# Patient Record
Sex: Male | Born: 1985 | Race: Black or African American | Hispanic: No | Marital: Single | Smoking: Current every day smoker
Health system: Southern US, Community
[De-identification: ages and names within clinical notes are randomized; demographics above are authoritative.]

## PROBLEM LIST (undated history)

## (undated) DIAGNOSIS — M549 Dorsalgia, unspecified: Secondary | ICD-10-CM

## (undated) DIAGNOSIS — G8929 Other chronic pain: Secondary | ICD-10-CM

## (undated) DIAGNOSIS — R011 Cardiac murmur, unspecified: Secondary | ICD-10-CM

---

## 2010-05-04 ENCOUNTER — Emergency Department (HOSPITAL_COMMUNITY): Admission: EM | Admit: 2010-05-04 | Discharge: 2010-05-04 | Payer: Self-pay | Admitting: Emergency Medicine

## 2010-11-29 ENCOUNTER — Emergency Department (HOSPITAL_COMMUNITY)
Admission: EM | Admit: 2010-11-29 | Discharge: 2010-11-29 | Payer: Self-pay | Source: Home / Self Care | Admitting: Emergency Medicine

## 2011-02-13 ENCOUNTER — Emergency Department (HOSPITAL_COMMUNITY)
Admission: EM | Admit: 2011-02-13 | Discharge: 2011-02-14 | Disposition: A | Discharge status: Home / Self Care | Payer: Worker's Compensation | Attending: Emergency Medicine | Admitting: Emergency Medicine

## 2011-02-13 DIAGNOSIS — M545 Low back pain, unspecified: Secondary | ICD-10-CM | POA: Insufficient documentation

## 2011-02-13 DIAGNOSIS — G8929 Other chronic pain: Secondary | ICD-10-CM | POA: Insufficient documentation

## 2012-09-19 ENCOUNTER — Encounter (HOSPITAL_COMMUNITY): Payer: Self-pay | Admitting: *Deleted

## 2012-09-19 DIAGNOSIS — M549 Dorsalgia, unspecified: Secondary | ICD-10-CM | POA: Insufficient documentation

## 2012-09-19 DIAGNOSIS — N5089 Other specified disorders of the male genital organs: Secondary | ICD-10-CM | POA: Insufficient documentation

## 2012-09-19 DIAGNOSIS — N453 Epididymo-orchitis: Secondary | ICD-10-CM | POA: Insufficient documentation

## 2012-09-19 LAB — URINALYSIS, ROUTINE W REFLEX MICROSCOPIC
Glucose, UA: NEGATIVE mg/dL
Hgb urine dipstick: NEGATIVE
Leukocytes, UA: NEGATIVE
Protein, ur: NEGATIVE mg/dL
Specific Gravity, Urine: 1.03 (ref 1.005–1.030)
Urobilinogen, UA: 1 mg/dL (ref 0.0–1.0)
pH: 6 (ref 5.0–8.0)

## 2012-09-19 LAB — COMPREHENSIVE METABOLIC PANEL
AST: 18 U/L (ref 0–37)
BUN: 13 mg/dL (ref 6–23)
Creatinine, Ser: 1.2 mg/dL (ref 0.50–1.35)
Potassium: 3.7 mEq/L (ref 3.5–5.1)
Total Protein: 7.5 g/dL (ref 6.0–8.3)

## 2012-09-19 LAB — LIPASE, BLOOD: Lipase: 54 U/L (ref 11–59)

## 2012-09-19 LAB — CBC WITH DIFFERENTIAL/PLATELET
Basophils Absolute: 0 10*3/uL (ref 0.0–0.1)
Basophils Relative: 0 % (ref 0–1)
Eosinophils Absolute: 0.1 10*3/uL (ref 0.0–0.7)
Eosinophils Relative: 1 % (ref 0–5)
HCT: 42 % (ref 39.0–52.0)
Lymphocytes Relative: 19 % (ref 12–46)
MCV: 80.6 fL (ref 78.0–100.0)
Monocytes Absolute: 0.4 10*3/uL (ref 0.1–1.0)
Monocytes Relative: 6 % (ref 3–12)
Neutro Abs: 5.3 10*3/uL (ref 1.7–7.7)
Platelets: 167 10*3/uL (ref 150–400)
RBC: 5.21 MIL/uL (ref 4.22–5.81)
RDW: 11.9 % (ref 11.5–15.5)

## 2012-09-19 NOTE — ED Notes (Signed)
The pt has several symptoms with testicle swelling just noticed this am.  Also lower back pain for awhile with abd pain for several days.    No nv or diarrhea some chills

## 2012-09-20 ENCOUNTER — Emergency Department (HOSPITAL_COMMUNITY): Payer: Self-pay

## 2012-09-20 ENCOUNTER — Emergency Department (HOSPITAL_COMMUNITY)
Admission: EM | Admit: 2012-09-20 | Discharge: 2012-09-20 | Disposition: A | Discharge status: Home / Self Care | Payer: Self-pay | Attending: Emergency Medicine | Admitting: Emergency Medicine

## 2012-09-20 DIAGNOSIS — N453 Epididymo-orchitis: Secondary | ICD-10-CM

## 2012-09-20 MED ORDER — NAPROXEN 500 MG PO TABS: 500.0000 mg | ORAL_TABLET | Freq: Two times a day (BID) | ORAL | Status: DC

## 2012-09-20 MED ORDER — OXYCODONE-ACETAMINOPHEN 5-325 MG PO TABS: 1.0000 | ORAL_TABLET | ORAL | Status: DC | PRN

## 2012-09-20 MED ORDER — CEFTRIAXONE SODIUM 1 G IJ SOLR
INTRAMUSCULAR | Status: AC
  Filled 2012-09-20: qty 10

## 2012-09-20 MED ORDER — OXYCODONE-ACETAMINOPHEN 5-325 MG PO TABS
ORAL_TABLET | ORAL | Status: AC
  Filled 2012-09-20: qty 2

## 2012-09-20 MED ORDER — LIDOCAINE HCL (PF) 1 % IJ SOLN
INTRAMUSCULAR | Status: AC
  Filled 2012-09-20: qty 5

## 2012-09-20 MED ORDER — DOXYCYCLINE HYCLATE 100 MG PO CAPS: 100.0000 mg | ORAL_CAPSULE | Freq: Two times a day (BID) | ORAL | Status: DC

## 2012-09-20 MED ORDER — AZITHROMYCIN 250 MG PO TABS
ORAL_TABLET | ORAL | Status: AC
  Filled 2012-09-20: qty 4

## 2012-09-20 NOTE — ED Provider Notes (Signed)
Swelling of testicle with pain X 24 hours, persistent, worse with palpatino, no d/c or f/c/n/v.    On exam has swollen and ttp testicle on R, normal cremasteric reflex, no urethral d/c, scrotum normal  [place] neg for torsion - likely epididymo orchitis - abx given, swabbed, f/u with Uro  Medical screening examination/treatment/procedure(s) were conducted as a shared visit with non-physician practitioner(s) and myself.  I personally evaluated the patient during the encounter    Vida Roller, MD 09/20/12 [phone]

## 2012-09-21 LAB — GC/CHLAMYDIA PROBE AMP, GENITAL: GC Probe Amp, Genital: NEGATIVE

## 2012-09-21 NOTE — ED Provider Notes (Signed)
Medical screening examination/treatment/procedure(s) were conducted as a shared visit with non-physician practitioner(s) and myself.  I personally evaluated the patient during the encounter  Please see my separate respective documentation pertaining to this patient encounter   Vida Roller, MD 09/21/12 1601

## 2012-09-21 NOTE — ED Provider Notes (Signed)
History     CSN: [id number]  Arrival date & time 09/19/12  2246   First MD Initiated Contact with Patient 09/20/12 0246      Chief Complaint  Patient presents with  . Groin Swelling    (Consider location/radiation/quality/duration/timing/severity/associated sxs/prior treatment) HPI Comments: 26 y/o male presents to the ED complaining of right sided testicular pain and swelling he noticed yesterday. Pain radiates down into his groin area and lower back. Describes pain as sharp rated 10/10 worse with palpation or movement. Has not tried any alleviating factors. Denies nausea, vomiting, fever, chills, penile pain or discharge. Has only had 1 sexual partner for the past year and a half.   The history is provided by the patient.    History reviewed. No pertinent past medical history.  History reviewed. No pertinent past surgical history.  No family history on file.  History  Substance Use Topics  . Smoking status: Never Smoker   . Smokeless tobacco: Not on file  . Alcohol Use: Yes      Review of Systems  Constitutional: Negative for fever and chills.  HENT: Negative for neck pain and neck stiffness.   Respiratory: Negative for shortness of breath.   Cardiovascular: Negative for chest pain.  Gastrointestinal: Negative for nausea, vomiting and abdominal pain.  Genitourinary: Positive for scrotal swelling and testicular pain. Negative for decreased urine volume, discharge, penile swelling, difficulty urinating and penile pain.  Musculoskeletal: Positive for back pain.  Skin: Negative for color change.  Neurological: Negative for dizziness and numbness.  Psychiatric/Behavioral: The patient is not nervous/anxious.     Allergies  Review of patient's allergies indicates not on file.  Home Medications   Current Outpatient Rx  Name  Route  Sig  Dispense  Refill  . DOXYCYCLINE HYCLATE 100 MG PO CAPS   Oral   Take 1 capsule (100 mg total) by mouth 2 (two) times daily.   20  capsule   0   . NAPROXEN 500 MG PO TABS   Oral   Take 1 tablet (500 mg total) by mouth 2 (two) times daily with a meal.   30 tablet   0   . OXYCODONE-ACETAMINOPHEN 5-325 MG PO TABS   Oral   Take 1 tablet by mouth every 4 (four) hours as needed for pain.   20 tablet   0     BP 128/66  Pulse 69  Temp 100.1 F (37.8 C) (Oral)  Resp 18  SpO2 98%  Physical Exam  Nursing note and vitals reviewed. Constitutional: He is oriented to person, place, and time. He appears well-developed and well-nourished. No distress.  HENT:  Head: Normocephalic and atraumatic.  Eyes: Conjunctivae normal and EOM are normal.  Neck: Normal range of motion. Neck supple.  Cardiovascular: Normal rate, regular rhythm and normal heart sounds.   Pulmonary/Chest: Effort normal and breath sounds normal.  Abdominal: Soft. Bowel sounds are normal. There is no tenderness.  Genitourinary: Penis normal. Right testis shows swelling and tenderness. Right testis shows no mass. Right testis is descended. Cremasteric reflex is not absent on the right side. Cremasteric reflex is not absent on the left side. Circumcised.       Testicles laying in anatomical position  Musculoskeletal: Normal range of motion. He exhibits no edema.  Neurological: He is alert and oriented to person, place, and time.  Skin: Skin is warm and dry. No erythema.  Psychiatric: He has a normal mood and affect. His behavior is normal.    ED  Course  Procedures (including critical care time)  Labs Reviewed  CBC WITH DIFFERENTIAL - Abnormal; Notable for the following:    MCHC 36.4 (*)     All other components within normal limits  COMPREHENSIVE METABOLIC PANEL - Abnormal; Notable for the following:    Glucose, Bld 120 (*)     GFR calc non Af Amer 82 (*)     All other components within normal limits  URINALYSIS, ROUTINE W REFLEX MICROSCOPIC  LIPASE, BLOOD  GC/CHLAMYDIA PROBE AMP, GENITAL   US Scrotum  09/20/2012  ------------------   *RADIOLOGY REPORT*  Clinical Data:  Back pain and scrotal swelling.  Bilateral testicular pain and right scrotal swelling.  SCROTAL ULTRASOUND DOPPLER ULTRASOUND OF THE TESTICLES  Technique: Complete ultrasound examination of the testicles, epididymis, and other scrotal structures was performed.  Color and spectral Doppler ultrasound were also utilized to evaluate blood flow to the testicles.  Comparison:  None.  Findings:  Right testis:  The right testis measures 4.9 x 2.5 x 3.8 cm. Normal homogeneous parenchymal echotexture without focal mass lesion.  Suggestion of increased testicular flow on color flow Doppler images.  Left testis:  The left testis measures 4.4 x 2.4 x 3.5 cm.  Normal homogeneous parenchymal echotexture without focal mass lesion. Suggestion of increased testicular flow on color flow Doppler images.  Right epididymis:  Right epididymis demonstrates normal flow. Small cyst or spermatic seal measuring about 3 mm diameter.  Left epididymis:  Left epididymis demonstrates mildly increased flow but is otherwise normal in size and appearance.  Hydrocele:  No scrotal hydroceles.  Varicocele:  No scrotal varicoceles.  Pulsed Doppler interrogation of both testes demonstrates low resistance flow bilaterally. Arterial and venous flow waveform patterns are obtained bilaterally.  IMPRESSION: Mild symmetrical increased flow demonstrated to both testes and to the left epididymis may represent mild epididymo-orchitis.  No solid mass.  No signs of torsion.   Original Report Authenticated By: Burman Nieves, M.D.      1. Epididymoorchitis       MDM  Scrotal US negative for torsion. Discussed tx for gc/chlamydia with patient. He was agreeable. IM rocephin and 1 g azithromycin given. Case discussed with Dr. Hyacinth Meeker who also evaluated patient and agrees with plan of care.        Trevor Mace, PA-C 09/21/12 1401

## 2013-07-02 ENCOUNTER — Encounter (HOSPITAL_COMMUNITY): Payer: Self-pay | Admitting: *Deleted

## 2013-07-02 ENCOUNTER — Emergency Department (HOSPITAL_COMMUNITY)
Admission: EM | Admit: 2013-07-02 | Discharge: 2013-07-02 | Disposition: A | Discharge status: Home / Self Care | Payer: Self-pay | Attending: Emergency Medicine | Admitting: Emergency Medicine

## 2013-07-02 DIAGNOSIS — G8929 Other chronic pain: Secondary | ICD-10-CM | POA: Insufficient documentation

## 2013-07-02 DIAGNOSIS — Z87828 Personal history of other (healed) physical injury and trauma: Secondary | ICD-10-CM | POA: Insufficient documentation

## 2013-07-02 DIAGNOSIS — M5430 Sciatica, unspecified side: Secondary | ICD-10-CM | POA: Insufficient documentation

## 2013-07-02 DIAGNOSIS — M5431 Sciatica, right side: Secondary | ICD-10-CM

## 2013-07-02 DIAGNOSIS — F17200 Nicotine dependence, unspecified, uncomplicated: Secondary | ICD-10-CM | POA: Insufficient documentation

## 2013-07-02 DIAGNOSIS — Z791 Long term (current) use of non-steroidal anti-inflammatories (NSAID): Secondary | ICD-10-CM | POA: Insufficient documentation

## 2013-07-02 HISTORY — DX: Dorsalgia, unspecified: M54.9

## 2013-07-02 HISTORY — DX: Other chronic pain: G89.29

## 2013-07-02 MED ORDER — PREDNISONE 50 MG PO TABS: 50.0000 mg | ORAL_TABLET | Freq: Every day | ORAL | Status: DC

## 2013-07-02 MED ORDER — KETOROLAC TROMETHAMINE 60 MG/2ML IM SOLN
60.0000 mg | Freq: Once | INTRAMUSCULAR | Status: AC
  Administered 2013-07-02: 60 mg via INTRAMUSCULAR
  Filled 2013-07-02: qty 2

## 2013-07-02 MED ORDER — HYDROCODONE-ACETAMINOPHEN 5-325 MG PO TABS
1.0000 | ORAL_TABLET | Freq: Once | ORAL | Status: AC
  Administered 2013-07-02: 1 via ORAL
  Filled 2013-07-02: qty 1

## 2013-07-02 MED ORDER — CYCLOBENZAPRINE HCL 10 MG PO TABS: 10.0000 mg | ORAL_TABLET | Freq: Three times a day (TID) | ORAL | Status: DC | PRN

## 2013-07-02 MED ORDER — HYDROCODONE-ACETAMINOPHEN 5-325 MG PO TABS: 1.0000 | ORAL_TABLET | Freq: Four times a day (QID) | ORAL | Status: DC | PRN

## 2013-07-02 NOTE — ED Provider Notes (Signed)
  CSN: [id number]     Arrival date & time 07/02/13  0810 History     First MD Initiated Contact with Patient 07/02/13 0813     Chief Complaint  Patient presents with  . Back Pain   (Consider location/radiation/quality/duration/timing/severity/associated sxs/prior Treatment) HPI Patient presents to the emergency department with lower back pain that started yesterday.  Patient, states he has a history of lower back pain from an injury to and after 3 years ago.  Patient, states, that he found that his grandmother died and laid on the floor for an extended amount of time and noted that his back pain started after this episode.  Patient, states, that he did not take any medications prior to arrival for his symptoms.  Patient, states he use heat on his lower back without significant relief.  Patient, states, that he has not have any chest pain, shortness of breath, fever, cough, dizziness, nausea, vomiting, abdominal pain, weakness, or numbness.  Patient, states pain does seem to go into his right leg from his lower back.  Movement and palpation make his pain, worse.  Lying flat seems to help with his discomfort Past Medical History  Diagnosis Date  . Chronic back pain    History reviewed. No pertinent past surgical history. No family history on file. History  Substance Use Topics  . Smoking status: Current Every Day Smoker  . Smokeless tobacco: Not on file  . Alcohol Use: Yes    Review of Systems All other systems negative except as documented in the HPI. All pertinent positives and negatives as reviewed in the HPI. Allergies  Review of patient's allergies indicates no known allergies.  Home Medications   Current Outpatient Rx  Name  Route  Sig  Dispense  Refill  . doxycycline (VIBRAMYCIN) 100 MG capsule   Oral   Take 1 capsule (100 mg total) by mouth 2 (two) times daily.   20 capsule   0   . naproxen (NAPROSYN) 500 MG tablet   Oral   Take 1 tablet (500 mg total) by mouth 2 (two)  times daily with a meal.   30 tablet   0   . oxyCODONE-acetaminophen (PERCOCET) 5-325 MG per tablet   Oral   Take 1 tablet by mouth every 4 (four) hours as needed for pain.   20 tablet   0    BP 143/76  Pulse 79  Temp(Src) 97.5 F (36.4 C) (Oral)  Resp 20  SpO2 100% Physical Exam  Nursing note and vitals reviewed. Constitutional: He is oriented to person, place, and time. He appears well-developed and well-nourished.  HENT:  Head: Normocephalic and atraumatic.  Cardiovascular: Normal rate and regular rhythm.   Pulmonary/Chest: Effort normal and breath sounds normal.  Neurological: He is alert and oriented to person, place, and time. He has normal strength and normal reflexes. He displays normal reflexes. No sensory deficit. He exhibits normal muscle tone. Coordination normal.  Skin: Skin is warm and dry.    ED Course   Procedures (including critical care time) Patient does not have any focal neurological deficits or motor deficits noted on exam.  He has normal deep tendon reflexes.  The patient will be given pain control and advised to use ice and heat on his lower back.  Also doesn't follow up with a doctor.  He was seen previously for his lower back issues for any recheck.  MDM    Carlyle Dolly, PA-C 07/02/13 [phone]

## 2013-07-02 NOTE — ED Notes (Signed)
C/o low back pain since last night. Had a previous injury to low back 2 years ago

## 2013-07-02 NOTE — ED Notes (Signed)
Had an injury to low back at work 2 years ago & has had problems with pain since. Has been to see a spine specialist in past & states ran out of pain meds 6 months ago. Denies recent injury. Reports pain worse with walking & sitting up. Pt drove self here & was ambulatory to room from triage. Denies urinary frequency, dysuria, incontinence.

## 2013-07-02 NOTE — ED Provider Notes (Signed)
Medical screening examination/treatment/procedure(s) were performed by non-physician practitioner and as supervising physician I was immediately available for consultation/collaboration.   Darlys Gales, MD 07/02/13 2227

## 2013-07-25 ENCOUNTER — Emergency Department (HOSPITAL_COMMUNITY)
Admission: EM | Admit: 2013-07-25 | Discharge: 2013-07-25 | Disposition: A | Discharge status: Home / Self Care | Payer: Self-pay | Attending: Emergency Medicine | Admitting: Emergency Medicine

## 2013-07-25 ENCOUNTER — Encounter (HOSPITAL_COMMUNITY): Payer: Self-pay | Admitting: Nurse Practitioner

## 2013-07-25 DIAGNOSIS — W268XXA Contact with other sharp object(s), not elsewhere classified, initial encounter: Secondary | ICD-10-CM | POA: Insufficient documentation

## 2013-07-25 DIAGNOSIS — Y929 Unspecified place or not applicable: Secondary | ICD-10-CM | POA: Insufficient documentation

## 2013-07-25 DIAGNOSIS — F17200 Nicotine dependence, unspecified, uncomplicated: Secondary | ICD-10-CM | POA: Insufficient documentation

## 2013-07-25 DIAGNOSIS — S60459A Superficial foreign body of unspecified finger, initial encounter: Secondary | ICD-10-CM | POA: Insufficient documentation

## 2013-07-25 DIAGNOSIS — Y93G9 Activity, other involving cooking and grilling: Secondary | ICD-10-CM | POA: Insufficient documentation

## 2013-07-25 DIAGNOSIS — G8929 Other chronic pain: Secondary | ICD-10-CM | POA: Insufficient documentation

## 2013-07-25 DIAGNOSIS — Z880 Allergy status to penicillin: Secondary | ICD-10-CM | POA: Insufficient documentation

## 2013-07-25 DIAGNOSIS — IMO0002 Reserved for concepts with insufficient information to code with codable children: Secondary | ICD-10-CM

## 2013-07-25 MED ORDER — DIAZEPAM 5 MG PO TABS
5.0000 mg | ORAL_TABLET | Freq: Once | ORAL | Status: AC
  Administered 2013-07-25: 5 mg via ORAL
  Filled 2013-07-25: qty 1

## 2013-07-25 NOTE — ED Notes (Addendum)
Pt was cooking last night and a hard noodle lodged underneath his R 2nd fingernail. C/o pain since. Able to visualize where the noodle went under fingernail, skin is loosened from fingernail.

## 2013-07-25 NOTE — ED Provider Notes (Addendum)
CSN: [id number]     Arrival date & time 07/25/13  1002 History   First MD Initiated Contact with Patient 07/25/13 1012     Chief Complaint  Patient presents with  . Finger Injury   (Consider location/radiation/quality/duration/timing/severity/associated sxs/prior Treatment) HPI Comments: Patient is a 27 yo M presenting to the ED for a piece of dry pasta embedded under his right index finger that occurred last evening. Patient states his is having sharp localized pain to the digit, and has been unable to remove the FB. No alleviating or aggravating factors. Denies fevers or chills.    Past Medical History  Diagnosis Date  . Chronic back pain    History reviewed. No pertinent past surgical history. History reviewed. No pertinent family history. History  Substance Use Topics  . Smoking status: Current Every Day Smoker  . Smokeless tobacco: Not on file  . Alcohol Use: Yes    Review of Systems  Constitutional: Negative for fever and chills.  Musculoskeletal: Negative for joint swelling.  Skin:       FB    Allergies  Penicillins  Home Medications   Current Outpatient Rx  Name  Route  Sig  Dispense  Refill  . cyclobenzaprine (FLEXERIL) 10 MG tablet   Oral   Take 10 mg by mouth 4 (four) times daily as needed for muscle spasms.         Marland Kitchen HYDROcodone-acetaminophen (NORCO/VICODIN) 5-325 MG per tablet   Oral   Take 1.5 tablets by mouth every 6 (six) hours as needed for pain.         . predniSONE (DELTASONE) 50 MG tablet   Oral   Take 50 mg by mouth 2 (two) times daily as needed (for back and leg pain).          BP 140/80  Pulse 71  Temp(Src) 97.9 F (36.6 C) (Oral)  Resp 16  Ht 6\' 2"  (1.88 m)  Wt 230 lb (104.327 kg)  BMI 29.52 kg/m2  SpO2 98% Physical Exam  Constitutional: He is oriented to person, place, and time. He appears well-developed and well-nourished. No distress.  HENT:  Head: Normocephalic and atraumatic.  Right Ear: External ear normal.  Left Ear:  External ear normal.  Nose: Nose normal.  Eyes: Conjunctivae are normal.  Neck: Neck supple.  Pulmonary/Chest: Effort normal.  Musculoskeletal: Normal range of motion.  Neurological: He is alert and oriented to person, place, and time.  Skin: Skin is warm and dry. He is not diaphoretic.  Pasta embedded under right index finger nail, no drainage or erythema or warmth. ROM intact in digit. TTP.   Psychiatric: He has a normal mood and affect.    ED Course  NERVE BLOCK Date/Time: 07/25/2013 11:16 AM Performed by: Jeannetta Ellis Authorized by: Jeannetta Ellis Consent: Verbal consent obtained. Indications: pain relief Body area: upper extremity Nerve: digital Laterality: right Patient sedated: no Patient position: sitting Needle gauge: 24 G Location technique: anatomical landmarks Local anesthetic: lidocaine 2% without epinephrine Anesthetic total: 10 ml Outcome: pain improved Patient tolerance: Patient tolerated the procedure well with no immediate complications.  FOREIGN BODY REMOVAL Date/Time: 08/02/2013 5:58 PM Performed by: Jeannetta Ellis Authorized by: Jeannetta Ellis Consent: Verbal consent obtained. Body area: skin General location: upper extremity Location details: right index finger Anesthesia: digital block Patient sedated: no Patient restrained: no Localization method: visualized Removal mechanism: scalpel, forceps and hemostat Dressing: antibiotic ointment and dressing applied Tendon involvement: none Depth: subcutaneous Complexity: simple 1 objects  recovered. Objects recovered: pasta noodle Post-procedure assessment: foreign body removed Patient tolerance: Patient tolerated the procedure well with no immediate complications.     (including critical care time) Labs Review Labs Reviewed - No data to display Imaging Review No results found.  MDM   1. Foreign body    Afebrile, NAD, non-toxic appearing, AAOx4. FB  underneath R index finger nail bed. Digital block provided for relief and FB removal. FB successfully removed, nail cut back to allow any infection to drain out. Advised warm soap and water soaks to keep area clean. Discussed sympomatic care. Return precautions discussed. Patient is agreeable to plan. Patient d/w with Dr. Blinda Leatherwood, agrees with plan. Patient is stable at time of discharge        Jeannetta Ellis, PA-C 07/25/13 1533  Jeannetta Ellis, PA-C 08/02/13 1759

## 2013-07-28 NOTE — ED Provider Notes (Signed)
Medical screening examination/treatment/procedure(s) were performed by non-physician practitioner and as supervising physician I was immediately available for consultation/collaboration.   Christopher J. Pollina, MD 07/28/13 1604 

## 2013-08-03 NOTE — ED Provider Notes (Signed)
Medical screening examination/treatment/procedure(s) were performed by non-physician practitioner and as supervising physician I was immediately available for consultation/collaboration.    Christopher J. Pollina, MD 08/03/13 1524 

## 2015-07-10 ENCOUNTER — Encounter (HOSPITAL_COMMUNITY): Payer: Self-pay | Admitting: *Deleted

## 2015-07-10 ENCOUNTER — Emergency Department (HOSPITAL_COMMUNITY)
Admission: EM | Admit: 2015-07-10 | Discharge: 2015-07-10 | Disposition: A | Discharge status: Home / Self Care | Payer: 59 | Attending: Physician Assistant | Admitting: Physician Assistant

## 2015-07-10 DIAGNOSIS — K0889 Other specified disorders of teeth and supporting structures: Secondary | ICD-10-CM

## 2015-07-10 DIAGNOSIS — Z720 Tobacco use: Secondary | ICD-10-CM | POA: Insufficient documentation

## 2015-07-10 DIAGNOSIS — K088 Other specified disorders of teeth and supporting structures: Secondary | ICD-10-CM | POA: Diagnosis not present

## 2015-07-10 DIAGNOSIS — K047 Periapical abscess without sinus: Secondary | ICD-10-CM | POA: Diagnosis not present

## 2015-07-10 DIAGNOSIS — Z880 Allergy status to penicillin: Secondary | ICD-10-CM | POA: Diagnosis not present

## 2015-07-10 DIAGNOSIS — G8929 Other chronic pain: Secondary | ICD-10-CM | POA: Insufficient documentation

## 2015-07-10 MED ORDER — TRAMADOL HCL 50 MG PO TABS: 50.0000 mg | ORAL_TABLET | Freq: Four times a day (QID) | ORAL | Status: DC | PRN

## 2015-07-10 MED ORDER — CLINDAMYCIN HCL 150 MG PO CAPS: 300.0000 mg | ORAL_CAPSULE | Freq: Three times a day (TID) | ORAL | Status: DC

## 2015-07-10 NOTE — Discharge Instructions (Signed)
Take the prescribed medication as directed. Follow-up with dentist-- see referral and resource guide provided. Return to the ED for new or worsening symptoms.   Emergency Department Resource Guide 1) Find a Doctor and Pay Out of Pocket Although you won't have to find out who is covered by your insurance plan, it is a good idea to ask around and get recommendations. You will then need to call the office and see if the doctor you have chosen will accept you as a new patient and what types of options they offer for patients who are self-pay. Some doctors offer discounts or will set up payment plans for their patients who do not have insurance, but you will need to ask so you aren't surprised when you get to your appointment.  2) Contact Your Local Health Department Not all health departments have doctors that can see patients for sick visits, but many do, so it is worth a call to see if yours does. If you don't know where your local health department is, you can check in your phone book. The CDC also has a tool to help you locate your state's health department, and many state websites also have listings of all of their local health departments.  3) Find a Walk-in Clinic If your illness is not likely to be very severe or complicated, you may want to try a walk in clinic. These are popping up all over the country in pharmacies, drugstores, and shopping centers. They're usually staffed by nurse practitioners or physician assistants that have been trained to treat common illnesses and complaints. They're usually fairly quick and inexpensive. However, if you have serious medical issues or chronic medical problems, these are probably not your best option.  No Primary Care Doctor: - Call Health Connect at  [phone] - they can help you locate a primary care doctor that  accepts your insurance, provides certain services, etc. - Physician Referral Service- [phone]  Chronic Pain  Problems: Organization         Address  Phone   Notes  [facility]  [phone] Patients need to be referred by their primary care doctor.   Medication Assistance: Organization         Address  Phone   Notes  [facility] Medication [facility] [address] [place] [place], [place] [address] [phone] --Must be a resident of [facility] -- Must have NO insurance coverage whatsoever (no Medicaid/ Medicare, etc.) -- The pt. MUST have a primary care doctor that directs their care regularly and follows them in the community   [facility]ist  [phone]   [employer]  [phone]    Agencies that provide inexpensive medical care: Organization         Address  Phone   Notes  [facility]  [phone]   [facility]    [phone]   [facility] [address] [place], [place] [address] [phone]   [facility] [address] [place]. [address] [place] [phone]   Planned Parenthood    [phone]   [facility]    [phone]   Community [facility] and [facility]  [address], [place] Phone:  [phone], Fax:  [phone] Hours of Operation:  9 am - 6 pm, M-F.  Also accepts Medicaid/Medicare and self-pay.  [facility]  [address], [place] Phone: [phone], Fax: [phone]. Hours of Operation:  8:30 am - 5:30 pm, M-F.  Also accepts Medicaid and self-pay.  [facility] [place] [address] [place] Phone: [phone]   [place], [place], [place] [phone], Ext. [phone] Mondays & Thursdays: 7-9 AM.  First 15 patients are seen on a first come, first serve basis.    [place] Providers:  Organization         Address  Phone   Notes  [facility] [address], [place] [phone] Also  accepts self-pay patients.  [facility] [address], [place]  [phone]   [place], [address], [place] [phone]   [facility] [address] [place] [phone]   Lucianne Lei [address], [place]   [phone] Only accepts [place] Access [place] patients after they have their name applied to their card.   Self-Pay (no insurance) in [facility]:  Organization         Address  Phone   Notes  Sickle Cell Patients, [facility] [place] [phone]   [facility] [place] [phone]   [facility] [place]  [place], [place], [place] [phone]   [facility]/Dr. Osei-Bonsu  [address], [place] or [address], [place] [phone] Phone number for both [place] and [place] locations is the same.  Urgent Medical and [facility] [address], [place] [phone]   [facility] [address] [place] or [address] [phone]   [facility] [address] [place] [address] [place] [phone], phone; [phone], fax Sees patients 1st and 3rd Saturday of every month.  Must not qualify for public or private insurance (i.e. Medicaid, Medicare, [place] Health Choice, Veterans' Benefits)  Household income should be no more than 200% of the poverty level The clinic cannot treat you if you are pregnant or think you are pregnant  Sexually transmitted diseases are not treated at the clinic.    Dental Care: Organization         Address  Phone  Notes  [facility] [place] [phone] Accepts children up to age 85 who are enrolled in [place] or [place]; pregnant  women with a Medicaid card; and children who have applied for Medicaid or [place] Health Choice, but were declined, whose parents can pay a reduced fee at time of service.  [facility] Department of [facility]  [address], [place] [phone] Accepts children up to age 44 who are enrolled in [place] or [place]; pregnant women with a Medicaid card; and children who have applied for Medicaid or [place] Health Choice, but were declined, whose parents can pay a reduced fee at time of service.  [place] [facility] PRO[facility]  [place] [phone] Patients are seen by appointment only. Walk-ins are not accepted. [place] will see patients 58 years of age and older. Monday - Tuesday (8am-5pm) Most Wednesdays (8:30-5pm) $30 per visit, cash only  [facility]  [address],  [place] [phone] Patients are seen by appointment only. Walk-ins are not accepted. [place] will see patients 62 years of age and older. One Wednesday Evening (Monthly: Volunteer Based).  $30 per visit, cash only  [place]  [phone] for adults; Children under age 77, call Graduate Pediatric Dentistry at [phone]. Children aged 45-14, please call [phone] to request a pediatric application.  Dental services are provided in all areas of dental care including fillings, crowns and bridges, complete and partial dentures, implants, gum treatment, root canals, and extractions. Preventive care is also provided. Treatment is provided to both adults and children. Patients are selected via a lottery and there is often a waiting list.   [facility] [place] [address], [place]  [phone] [email or web address]   [facility] [address] [place], [place] [phone], Ext. [phone] Second and Fourth Thursday of each month, opens at 6:30 AM; Clinic ends at 9 AM.  Patients are  seen on a first-come first-served basis, and a limited number are seen during each clinic.   [facility]  [address] [place], [place] [phone]   Eligibility Requirements You must have lived in [place], [place], or [place] counties for at least the last three months.   You cannot be eligible for state or federal sponsored [employer], including [employer], [place], or [employer].   You generally cannot be eligible for healthcare insurance through your employer.    How to apply: Eligibility screenings are held every Tuesday and Wednesday afternoon from 1:00 pm until 4:00 pm. You do not need an appointment for the interview!  [facility] [address], [place], [place]   [place]  [place] Department  [place]  [phone]    Behavioral Health Resources in the Community: Intensive Outpatient Programs Organization         Address  Phone  Notes  [facility] [place]. [address] [place], [place] [phone]   [facility] Outpatient [address], [place], [place]   ADS: Alcohol & Drug Svcs [address], [place], [place]   [place] [address]  [place], [place] or [phone]   Substance Abuse Resources Organization         Address  Phone  Notes  Alcohol and Drug Services  [phone]   [place]  [phone]   [facility] [place]   Chinita Pester  [phone]   Residential & Outpatient Substance Abuse Program  [phone]   Psychological Services Organization         Address  Phone  Notes  [facility] [place]  [place]  [phone]   [place] [address] [place] or [phone]    Mobile Crisis  Teams Organization         Address  Phone  Notes  [facility], Mobile Crisis Care Unit  [phone]   [facility] Psychotherapeutic Services  [address] [place], [place]   [place] [address], [place] [phone]    Self-Help/Support Groups Organization         Address  Phone             Notes  [place]. of [place] - variety of support  groups  [phone] Call for more information  Narcotics Anonymous (NA), [facility]aring [facility] [address], [employer] [place]  2 meetings at this location   Residential Facilities manager         Address  Phone  Notes  [facility] [place],    [place]  [phone]   [facility]  [address], [place] [address], [place], [place]   [place], [place] [phone] Admissions: 8am-3pm M-F  Incentives Substance [place] [address],    [place], [place] [phone]   [facility] [address] [place], [place], [place]   [facility] [address],  [place], [place]   [facility] - Intensive Outpatient [address], [place], [place]   [facility] ([place].) [place].,  [place], [place] [phone] or [phone]   [facility] Services (RTS) [address], [place], [place] Accepts Medicaid  [facility] [address]  [place] [phone] Substance Abuse/Addiction Treatment   [facility] Organization         Address  Phone  Notes  [facility]  9497234773   Domenic Schwab, PhD [address] [place]lis [place], [place]   [phone] or [phone]   [place]ice [place], [place] [phone]   [facility] [address] [place], [place] [phone]  Insurance/Medicaid/sponsorship through [facility] and Families [address]                                    [place], [place] [phone] [place] [address] [place], [place] [phone]    Dr. Adele Schilder  [phone]   [facility] Dept. 1) [address] [place] 2) [place] 3)  [place] [address], [place] [phone]  [phone]   [place] [phone] or [phone] (After Hours)

## 2015-07-10 NOTE — ED Notes (Signed)
Pt reports left bottom dental pain x3 days. Pain 8/10.

## 2015-07-10 NOTE — ED Provider Notes (Signed)
CSN: [id number]     Arrival date & time 07/10/15  1508 History  This chart was scribed for non-physician practitioner Sharilyn Sites, PA-C working with Abelino Derrick, MD by Littie Deeds, ED Scribe. This patient was seen in room WTR6/WTR6 and the patient's care was started at 4:19 PM.      Chief Complaint  Patient presents with  . Dental Pain   The history is provided by the patient. No language interpreter was used.   HPI Comments: Nathan Bernard is a 29 y.o. male who presents to the Emergency Department complaining of left lower dental pain for the past 3 days. Patient states pain has been progressively worsening, now he has some mild swelling of left lower cheek.  He reports difficulty chewing on affected side due to pain, no difficulty swallowing.Denies any fever or chills. No shortness of breath. Patient is not currently established with a dentist. No intervention tried prior to arrival.   Past Medical History  Diagnosis Date  . Chronic back pain    History reviewed. No pertinent past surgical history. History reviewed. No pertinent family history. Social History  Substance Use Topics  . Smoking status: Current Every Day Smoker  . Smokeless tobacco: None  . Alcohol Use: Yes    Review of Systems  HENT: Positive for dental problem.   All other systems reviewed and are negative.     Allergies  Penicillins  Home Medications   Prior to Admission medications   Medication Sig Start Date End Date Taking? Authorizing Provider  cyclobenzaprine (FLEXERIL) 10 MG tablet Take 10 mg by mouth 4 (four) times daily as needed for muscle spasms.    Historical Provider, MD  HYDROcodone-acetaminophen (NORCO/VICODIN) 5-325 MG per tablet Take 1.5 tablets by mouth every 6 (six) hours as needed for pain.    Historical Provider, MD  predniSONE (DELTASONE) 50 MG tablet Take 50 mg by mouth 2 (two) times daily as needed (for back and leg pain).    Historical Provider, MD   BP 144/74 mmHg   Pulse 83  Temp(Src) 97.2 F (36.2 C) (Oral)  Resp 18  SpO2 99%   Physical Exam  Constitutional: He is oriented to person, place, and time. He appears well-developed and well-nourished.  HENT:  Head: Normocephalic and atraumatic.  Mouth/Throat: Oropharynx is clear and moist.  Teeth largely in good dentition, left lower molar with defect along posterior aspect, surrounding gingiva swollen with likely developing dental infection, handling secretions appropriately, no trismus; slight swelling noted of left lower cheek without extension into neck; normal phonation, no stridor  Eyes: Conjunctivae and EOM are normal. Pupils are equal, round, and reactive to light.  Neck: Normal range of motion.  Cardiovascular: Normal rate, regular rhythm and normal heart sounds.   Pulmonary/Chest: Effort normal and breath sounds normal.  Abdominal: Soft. Bowel sounds are normal.  Musculoskeletal: Normal range of motion.  Neurological: He is alert and oriented to person, place, and time.  Skin: Skin is warm and dry.  Psychiatric: He has a normal mood and affect.  Nursing note and vitals reviewed.   ED Course  Procedures  DIAGNOSTIC STUDIES: Oxygen Saturation is 99% on room air, normal by my interpretation.    COORDINATION OF CARE: 4:19 PM-Discussed treatment plan which includes abx with patient/guardian at bedside and patient/guardian agreed to plan.    Labs Review Labs Reviewed - No data to display  Imaging Review No results found. I have personally reviewed and evaluated these images and lab results as part  of my medical decision-making.   EKG Interpretation None      MDM   Final diagnoses:  Pain, dental   29 year old male here with left lower dental pain. Appears to have developing dental abscess, no drainable fluid collection noted on exam.  Airway intact, no concerning findings for Ludwig's angina.  Will d/c home with abx.  Patient encouraged to FU with dentist-- referral and resource  guide provided.  Discussed plan with patient, he/she acknowledged understanding and agreed with plan of care.  Return precautions given for new or worsening symptoms.  I personally performed the services described in this documentation, which was scribed in my presence. The recorded information has been reviewed and is accurate.  Garlon Hatchet, PA-C 07/10/15 1646  Courteney Randall An, MD 07/10/15 [phone]

## 2016-12-21 ENCOUNTER — Encounter (HOSPITAL_COMMUNITY): Payer: Self-pay | Admitting: Emergency Medicine

## 2016-12-21 ENCOUNTER — Emergency Department (HOSPITAL_COMMUNITY)
Admission: EM | Admit: 2016-12-21 | Discharge: 2016-12-21 | Disposition: A | Discharge status: Home / Self Care | Payer: 59 | Attending: Emergency Medicine | Admitting: Emergency Medicine

## 2016-12-21 DIAGNOSIS — K0889 Other specified disorders of teeth and supporting structures: Secondary | ICD-10-CM | POA: Diagnosis not present

## 2016-12-21 DIAGNOSIS — K0381 Cracked tooth: Secondary | ICD-10-CM | POA: Diagnosis not present

## 2016-12-21 DIAGNOSIS — Z79899 Other long term (current) drug therapy: Secondary | ICD-10-CM | POA: Insufficient documentation

## 2016-12-21 DIAGNOSIS — F17200 Nicotine dependence, unspecified, uncomplicated: Secondary | ICD-10-CM | POA: Diagnosis not present

## 2016-12-21 DIAGNOSIS — S025XXA Fracture of tooth (traumatic), initial encounter for closed fracture: Secondary | ICD-10-CM

## 2016-12-21 MED ORDER — OXYCODONE-ACETAMINOPHEN 5-325 MG PO TABS: 2.0000 | ORAL_TABLET | Freq: Four times a day (QID) | ORAL | 0 refills | Status: DC | PRN

## 2016-12-21 MED ORDER — OXYCODONE-ACETAMINOPHEN 5-325 MG PO TABS
1.0000 | ORAL_TABLET | Freq: Once | ORAL | Status: AC
  Administered 2016-12-21: 1 via ORAL
  Filled 2016-12-21: qty 1

## 2016-12-21 NOTE — ED Notes (Signed)
Pt has a ride home.  

## 2016-12-21 NOTE — Discharge Instructions (Signed)
As discussed, for definitive care of your dental fracture is very important that you follow up with a dentist.  Please use the provided resources to be sure to be seen on Monday.  In addition to the provided medication, please be sure to use oral antibiotic rinse - available at a pharmacy   Return here for concerning changes in your condition.

## 2016-12-21 NOTE — ED Triage Notes (Signed)
Per pt, states left lower tooth pain-increased pain this am

## 2016-12-21 NOTE — ED Provider Notes (Signed)
[facility]-EMERGENCY DEPT Provider Note   CSN: [id number] Arrival date & time: 12/21/16  1533  By signing my name below, I, Nathan Bernard, attest that this documentation has been prepared under the direction and in the presence of Gerhard Munchobert Bethel Gaglio, MD. Electronically Signed: Doreatha MartinEva Bernard, ED Scribe. 12/21/16. 4:39 PM.     History   Chief Complaint Chief Complaint  Patient presents with  . Dental Pain    HPI Nathan Bernard is a 31 y.o. male who presents to the Emergency Department complaining of moderate, worsening left lower dental pain with radiation to the left ear and face that began last week. Pt states he felt the tooth break when his pain began. He states he has tried Tylenol #3 and one antibiotic pill with no relief of pain. Pt reports his pain is worsened with swallowing. He is not currently followed by a dentist. He denies fever, vomiting, chills, hearing loss, difficulty tolerating secretions.   The history is provided by the patient. No language interpreter was used.    Past Medical History:  Diagnosis Date  . Chronic back pain     Patient Active Problem List   Diagnosis Date Noted  . Chronic back pain     History reviewed. No pertinent surgical history.     Home Medications    Prior to Admission medications   Medication Sig Start Date End Date Taking? Authorizing Provider  clindamycin (CLEOCIN) 150 MG capsule Take 2 capsules (300 mg total) by mouth 3 (three) times daily. May dispense as 150mg  capsules 07/10/15   Garlon HatchetLisa M Sanders, PA-C  cyclobenzaprine (FLEXERIL) 10 MG tablet Take 10 mg by mouth 4 (four) times daily as needed for muscle spasms.    Historical Provider, MD  HYDROcodone-acetaminophen (NORCO/VICODIN) 5-325 MG per tablet Take 1.5 tablets by mouth every 6 (six) hours as needed for pain.    Historical Provider, MD  predniSONE (DELTASONE) 50 MG tablet Take 50 mg by mouth 2 (two) times daily as needed (for back and leg pain).    Historical Provider, MD    traMADol (ULTRAM) 50 MG tablet Take 1 tablet (50 mg total) by mouth every 6 (six) hours as needed. 07/10/15   Garlon HatchetLisa M Sanders, PA-C    Family History No family history on file.  Social History Social History  Substance Use Topics  . Smoking status: Current Every Day Smoker  . Smokeless tobacco: Not on file  . Alcohol use Yes     Allergies   Penicillins   Review of Systems Review of Systems  Constitutional: Negative for fever.  Respiratory: Negative for shortness of breath.   Cardiovascular: Negative for chest pain.  Musculoskeletal:       Negative aside from HPI  Skin:       Negative aside from HPI  Allergic/Immunologic: Negative for immunocompromised state.  Neurological: Negative for weakness.     Physical Exam Updated Vital Signs BP (!) 156/101 (BP Location: Left Arm)   Pulse 68   Temp 98.5 F (36.9 C) (Oral)   Resp 18   Ht 6\' 2"  (1.88 m)   Wt 230 lb (104.3 kg)   SpO2 99%   BMI 29.53 kg/m   Physical Exam  Constitutional: He is oriented to person, place, and time. He appears well-developed. No distress.  HENT:  Head: Normocephalic and atraumatic.  Mouth/Throat:    Eyes: Conjunctivae and EOM are normal.  Cardiovascular: Normal rate and regular rhythm.   Pulmonary/Chest: Effort normal. No stridor. No respiratory distress.  Abdominal: He  exhibits no distension.  Musculoskeletal: He exhibits no edema.  Neurological: He is alert and oriented to person, place, and time.  Skin: Skin is warm and dry.  Psychiatric: He has a normal mood and affect.  Nursing note and vitals reviewed.    ED Treatments / Results   DIAGNOSTIC STUDIES: Oxygen Saturation is 99% on RA, normal by my interpretation.    COORDINATION OF CARE: 4:38 PM Discussed treatment plan with pt at bedside which includes pain management, antibiotic rinse, dental f/u and pt agreed to plan.   Procedures Procedures (including critical care time)  Medications Ordered in ED Medications   oxyCODONE-acetaminophen (PERCOCET/ROXICET) 5-325 MG per tablet 1 tablet (not administered)     Initial Impression / Assessment and Plan / ED Course  I have reviewed the triage vital signs and the nursing notes.  Pertinent labs & imaging results that were available during my care of the patient were reviewed by me and considered in my medical decision making (see chart for details).   I personally performed the services described in this documentation, which was scribed in my presence. The recorded information has been reviewed and is accurate.    This generally well-appearing male presents with concern of left mandibular pain. Patient had evidence for fractured tooth, no evidence for deep space infection, no evidence for oropharyngeal compromise, no distress. Patient provided analgesia, instructions on home antibiotic mouth rinse, and the importance of following up with dental.    Gerhard Munch, MD 12/21/16 [phone]

## 2017-04-26 ENCOUNTER — Emergency Department (HOSPITAL_BASED_OUTPATIENT_CLINIC_OR_DEPARTMENT_OTHER)
Admission: EM | Admit: 2017-04-26 | Discharge: 2017-04-26 | Disposition: A | Discharge status: Home / Self Care | Payer: Self-pay | Attending: Emergency Medicine | Admitting: Emergency Medicine

## 2017-04-26 ENCOUNTER — Emergency Department (HOSPITAL_BASED_OUTPATIENT_CLINIC_OR_DEPARTMENT_OTHER): Payer: Self-pay

## 2017-04-26 ENCOUNTER — Encounter (HOSPITAL_BASED_OUTPATIENT_CLINIC_OR_DEPARTMENT_OTHER): Payer: Self-pay | Admitting: Emergency Medicine

## 2017-04-26 DIAGNOSIS — F17200 Nicotine dependence, unspecified, uncomplicated: Secondary | ICD-10-CM | POA: Insufficient documentation

## 2017-04-26 DIAGNOSIS — N50819 Testicular pain, unspecified: Secondary | ICD-10-CM

## 2017-04-26 DIAGNOSIS — N50811 Right testicular pain: Secondary | ICD-10-CM | POA: Insufficient documentation

## 2017-04-26 DIAGNOSIS — N5089 Other specified disorders of the male genital organs: Secondary | ICD-10-CM | POA: Diagnosis not present

## 2017-04-26 LAB — URINALYSIS, ROUTINE W REFLEX MICROSCOPIC
Bilirubin Urine: NEGATIVE
GLUCOSE, UA: NEGATIVE mg/dL
Ketones, ur: 15 mg/dL — AB
Nitrite: NEGATIVE
PH: 6 (ref 5.0–8.0)
Protein, ur: NEGATIVE mg/dL
SPECIFIC GRAVITY, URINE: 1.026 (ref 1.005–1.030)

## 2017-04-26 LAB — URINALYSIS, MICROSCOPIC (REFLEX)
Bacteria, UA: NONE SEEN
Squamous Epithelial / LPF: NONE SEEN

## 2017-04-26 MED ORDER — AZITHROMYCIN 250 MG PO TABS
1000.0000 mg | ORAL_TABLET | Freq: Once | ORAL | Status: AC
  Administered 2017-04-26: 1000 mg via ORAL
  Filled 2017-04-26: qty 4

## 2017-04-26 MED ORDER — CEFTRIAXONE SODIUM 250 MG IJ SOLR
250.0000 mg | INTRAMUSCULAR | Status: DC
  Administered 2017-04-26: 250 mg via INTRAMUSCULAR
  Filled 2017-04-26: qty 250

## 2017-04-26 MED ORDER — IBUPROFEN 800 MG PO TABS
800.0000 mg | ORAL_TABLET | Freq: Once | ORAL | Status: AC
  Administered 2017-04-26: 800 mg via ORAL
  Filled 2017-04-26: qty 1

## 2017-04-26 MED ORDER — DOXYCYCLINE HYCLATE 100 MG PO CAPS: 100.0000 mg | ORAL_CAPSULE | Freq: Two times a day (BID) | ORAL | 0 refills | Status: DC

## 2017-04-26 NOTE — ED Provider Notes (Signed)
[facility] Provider Note   CSN: [id number] Arrival date & time: 04/26/17  2021  By signing my name below, I, Nathan Bernard, attest that this documentation has been prepared under the direction and in the presence of [name], [employer] Electronically Signed: Soijett Bernard, ED Scribe. 04/26/17. 9:08 PM.  History   Chief Complaint Chief Complaint  Patient presents with  . Abdominal Pain    HPI Nathan Bernard is a 31 y.o. male who presents to the Emergency Department complaining of gradually worsening, right lower abdominal pain onset last night. Pt reports gradually worsening associated right testicular pain, testicular swelling, and nausea. Pt has not tried any medications for the relief of his symptoms. Pt reports that he has had sexual intercourse within the past 3 days with no pain. Denies concern for STD at this time. Denies back pain, dysuria, penile discharge, fever, penile lesions/sores, and any other symptoms.    The history is provided by the patient. No language interpreter was used.    Past Medical History:  Diagnosis Date  . Chronic back pain     Patient Active Problem List   Diagnosis Date Noted  . Chronic back pain     History reviewed. No pertinent surgical history.     Home Medications    Prior to Admission medications   Medication Sig Start Date End Date Taking? Authorizing Provider  clindamycin (CLEOCIN) 150 MG capsule Take 2 capsules (300 mg total) by mouth 3 (three) times daily. May dispense as 150mg  capsules 07/10/15   Garlon Hatchet, PA-C  cyclobenzaprine (FLEXERIL) 10 MG tablet Take 10 mg by mouth 4 (four) times daily as needed for muscle spasms.    [provider]  HYDROcodone-acetaminophen (NORCO/VICODIN) 5-325 MG per tablet Take 1.5 tablets by mouth every 6 (six) hours as needed for pain.    [provider]  oxyCODONE-acetaminophen (PERCOCET/ROXICET) 5-325 MG tablet Take 2 tablets by mouth every 6 (six) hours  as needed for severe pain. 12/21/16   Gerhard Munch, MD  predniSONE (DELTASONE) 50 MG tablet Take 50 mg by mouth 2 (two) times daily as needed (for back and leg pain).    [provider]  traMADol (ULTRAM) 50 MG tablet Take 1 tablet (50 mg total) by mouth every 6 (six) hours as needed. 07/10/15   Garlon Hatchet, PA-C    Family History No family history on file.  Social History Social History  Substance Use Topics  . Smoking status: Current Every Day Smoker  . Smokeless tobacco: Not on file  . Alcohol use Yes     Allergies   Penicillins   Review of Systems Review of Systems  Constitutional: Negative for fever.  Gastrointestinal: Positive for abdominal pain (right lower) and nausea.  Genitourinary: Positive for scrotal swelling and testicular pain (right). Negative for discharge, dysuria and genital sores.  Musculoskeletal: Negative for back pain.     Physical Exam Updated Vital Signs BP 137/81 (BP Location: Right Arm)   Pulse 74   Temp 99.7 F (37.6 C) (Oral)   Resp 18   Ht 6\' 2"  (1.88 m)   Wt 233 lb (105.7 kg)   SpO2 97%   BMI 29.92 kg/m   Physical Exam  Constitutional: He is oriented to person, place, and time. He appears well-developed and well-nourished. No distress.  HENT:  Head: Normocephalic and atraumatic.  Eyes: EOM are normal.  Neck: Neck supple.  Cardiovascular: Normal rate, regular rhythm and normal heart sounds.   Pulmonary/Chest: Effort normal.  No respiratory distress. He has no wheezes. He has no rales.  Abdominal: Soft. Bowel sounds are normal. He exhibits no distension. There is no tenderness.  Genitourinary:  Genitourinary Comments: Scribe chaperone present for exam. Right testicle with diffuse swelling, ttp. Cremasteric reflex presetn.   Musculoskeletal: Normal range of motion.  Neurological: He is alert and oriented to person, place, and time.  Skin: Skin is warm and dry.  Psychiatric: He has a normal mood and affect. His behavior  is normal.  Nursing note and vitals reviewed.    ED Treatments / Results  DIAGNOSTIC STUDIES: Oxygen Saturation is 97% on RA, nl by my interpretation.    COORDINATION OF CARE: 9:07 PM Discussed treatment plan with pt at bedside and pt agreed to plan.   Labs (all labs ordered are listed, but only abnormal results are displayed) Labs Reviewed - No data to display  EKG  EKG Interpretation None       Radiology No results found.  Procedures Procedures (including critical care time)  Medications Ordered in ED Medications - No data to display   Initial Impression / Assessment and Plan / ED Course  I have reviewed the triage vital signs and the nursing notes.  Pertinent labs & imaging results that were available during my care of the patient were reviewed by me and considered in my medical decision making (see chart for details).     Patient in the emergency department with right testicular pain and swelling. Differential include epididymitis, hydrocele, or distortion. We'll get urinalysis, GC chlamydia cultures, will get ultrasound.   Unable to get ultrasound since it is not available at [facility]  at this time. [facility].  Patient refusing transfer and wants to go home. She is understanding that this could be torsion and he may result in loss of testicle, infection, even potential death. He is alert and oriented and able to make decisions, and does not appear to be under influence. I will schedule him for ultrasound in the morning, he'll come back then. I will start him on doxycycline and given Rocephin emergency department to cover for infectious process. Precautions discussed.  Vitals:   04/26/17 2031 04/26/17 2032 04/26/17 2234  BP: 137/81  133/74  Pulse: 74  (!) 59  Resp: 18  18  Temp: 99.7 F (37.6 C)    TempSrc: Oral    SpO2: 97%  95%  Weight:  105.7 kg (233 lb)   Height:  6\' 2"  (1.88 m)      Final  Clinical Impressions(s) / ED Diagnoses   Final diagnoses:  Scrotum swelling  Testicular pain    New Prescriptions Discharge Medication List as of 04/26/2017 10:09 PM    START taking these medications   Details  doxycycline (VIBRAMYCIN) 100 MG capsule Take 1 capsule (100 mg total) by mouth 2 (two) times daily., Starting Sat 04/26/2017, Print       I personally performed the services described in this documentation, which was scribed in my presence. The recorded information has been reviewed and is accurate.     Jaynie CrumbleKirichenko, Shravan Salahuddin, PA-C 05/04/17 1201    Myriah Boggus, Larimoreatyana, PA-C 05/04/17 1201    Alvira MondaySchlossman, Erin, MD 05/04/17 1941

## 2017-04-26 NOTE — ED Notes (Signed)
C/o R scrotal/testicle pain and swelling, onset yesterday, (denies: d/c, injury, fever, vd, abd pain, bleeding), states, "always have some back pain, nothing new or changed", took tylenol PTA.   Alert, NAD, calm, interactive, resps e/u, speaking in clear complete sentences, no dyspnea noted, skin W&D, walks slow with guarded limp.

## 2017-04-26 NOTE — Discharge Instructions (Signed)
You are scheduled for [facility] at [facility]. You do not need to check and into the emergency department for this. Please return for imaging. You  chose not to have ultrasound today, and I have explained to you that I cannot rule out torsion of your testicle. Please taken a bionics as prescribed for possible infectious process.

## 2017-04-26 NOTE — ED Notes (Signed)
EDPA in room, at BS.  

## 2017-04-26 NOTE — ED Triage Notes (Addendum)
Pt presents with rt lower abdominal pain and rt testicle pain that started last night but worse today. Pt reports some nausea earlier today.

## 2017-04-27 ENCOUNTER — Inpatient Hospital Stay (HOSPITAL_BASED_OUTPATIENT_CLINIC_OR_DEPARTMENT_OTHER): Admit: 2017-04-27 | Payer: Self-pay

## 2017-04-28 LAB — GC/CHLAMYDIA PROBE AMP (~~LOC~~) NOT AT ARMC
CHLAMYDIA, DNA PROBE: NEGATIVE
Neisseria Gonorrhea: NEGATIVE

## 2017-06-24 ENCOUNTER — Encounter (HOSPITAL_BASED_OUTPATIENT_CLINIC_OR_DEPARTMENT_OTHER): Payer: Self-pay

## 2017-06-24 ENCOUNTER — Emergency Department (HOSPITAL_BASED_OUTPATIENT_CLINIC_OR_DEPARTMENT_OTHER)
Admission: EM | Admit: 2017-06-24 | Discharge: 2017-06-24 | Disposition: A | Discharge status: Home / Self Care | Payer: 59 | Attending: Emergency Medicine | Admitting: Emergency Medicine

## 2017-06-24 DIAGNOSIS — K0889 Other specified disorders of teeth and supporting structures: Secondary | ICD-10-CM | POA: Diagnosis not present

## 2017-06-24 DIAGNOSIS — F17210 Nicotine dependence, cigarettes, uncomplicated: Secondary | ICD-10-CM | POA: Insufficient documentation

## 2017-06-24 MED ORDER — CLINDAMYCIN HCL 150 MG PO CAPS
450.0000 mg | ORAL_CAPSULE | Freq: Once | ORAL | Status: AC
  Administered 2017-06-24: 450 mg via ORAL
  Filled 2017-06-24: qty 3

## 2017-06-24 MED ORDER — OXYCODONE-ACETAMINOPHEN 5-325 MG PO TABS
2.0000 | ORAL_TABLET | Freq: Once | ORAL | Status: AC
  Administered 2017-06-24: 2 via ORAL
  Filled 2017-06-24: qty 2

## 2017-06-24 MED ORDER — BUPIVACAINE-EPINEPHRINE (PF) 0.5% -1:200000 IJ SOLN
1.8000 mL | Freq: Once | INTRAMUSCULAR | Status: AC
  Administered 2017-06-24: 1.8 mL
  Filled 2017-06-24: qty 1.8

## 2017-06-24 MED ORDER — LIDOCAINE VISCOUS 2 % MT SOLN: 15.0000 mL | OROMUCOSAL | 2 refills | Status: DC | PRN

## 2017-06-24 MED ORDER — KETOROLAC TROMETHAMINE 60 MG/2ML IM SOLN
60.0000 mg | Freq: Once | INTRAMUSCULAR | Status: AC
  Administered 2017-06-24: 60 mg via INTRAMUSCULAR
  Filled 2017-06-24: qty 2

## 2017-06-24 MED ORDER — IBUPROFEN 600 MG PO TABS: 600.0000 mg | ORAL_TABLET | Freq: Four times a day (QID) | ORAL | 0 refills | Status: DC | PRN

## 2017-06-24 MED ORDER — CLINDAMYCIN HCL 150 MG PO CAPS: 450.0000 mg | ORAL_CAPSULE | Freq: Three times a day (TID) | ORAL | 0 refills | Status: AC

## 2017-06-24 NOTE — ED Notes (Signed)
ED Provider at bedside. 

## 2017-06-24 NOTE — Discharge Instructions (Signed)
You have been seen today for dental pain. You should follow up with a dentist as soon as possible. This problem will not resolve on its own without the care of a dentist. Use ibuprofen or naproxen for pain. Use the viscous lidocaine for mouth pain. Swish with the lidocaine and spit it out. Do not swallow it. ° °Please take all of your antibiotics until finished!   You may develop abdominal discomfort or diarrhea from the antibiotic.  You may help offset this with probiotics which you can buy or get in yogurt. Do not eat or take the probiotics until 2 hours after your antibiotic.  ° °Dental Resource Guide ° °[facility] °[address], [address] °[place], [place] [address] °[phone] ° °[facility] °[address] °[place], [place] [address] °[phone] ° °[facility] °[address] °[place], [place] [address] °[phone] ° °[facility] °[address] °[place], [place] [address] °[phone] ° °[facility] °[address] °[place], [place] [address] °[phone] ° °[employer] °Www.[facility]istry.[employer].edu/patientcare/studentclinics/becomepatient ° °[employer] [facility] [employer] [facility] °[address] °[place], [place] [address] °[phone] ° °Website for free, low-income, or sliding scale dental services in [place]: °www.freedental.us ° °To find a dentist in [place] and surrounding areas: °www.ncdental.org/for-the-public/find-a-dentist ° °[facility] °http://www.ncdental.org/meetings-events/[place] ° °[place] [facility] °https://dma.ncdhhs.gov/find-a-doctor/medicaid-dental-providers ° °

## 2017-06-24 NOTE — ED Triage Notes (Signed)
Left lower toothache-hx of issues with tooth but bit down with pain today-NAD-steady gait

## 2017-06-24 NOTE — ED Provider Notes (Signed)
[facility]   CSN: [id number] Arrival date & time: 06/24/17  8295     History   Chief Complaint Chief Complaint  Patient presents with  . Dental Pain    HPI Nathan Bernard is a 31 y.o. male.  HPI   Nathan Bernard is a 31 y.o. male, patient with no pertinent past medical history, presenting to the ED with left lower dental pain over the last couple months, worse over the last week. States he broke this tooth, but does not remember how. Pain is 10 out of 10, aching, nonradiating. Denies fever/chills, nausea/vomiting, difficulty swallowing or breathing, facial swelling, or any other complaints.     Past Medical History:  Diagnosis Date  . Chronic back pain     Patient Active Problem List   Diagnosis Date Noted  . Chronic back pain     History reviewed. No pertinent surgical history.     Home Medications    Prior to Admission medications   Medication Sig Start Date End Date Taking? Authorizing Provider  clindamycin (CLEOCIN) 150 MG capsule Take 3 capsules (450 mg total) by mouth 3 (three) times daily. 06/24/17 07/01/17  Lucerito Rosinski C, PA-C  ibuprofen (ADVIL,MOTRIN) 600 MG tablet Take 1 tablet (600 mg total) by mouth every 6 (six) hours as needed. 06/24/17   Kodee Ravert C, PA-C  lidocaine (XYLOCAINE) 2 % solution Use as directed 15 mLs in the mouth or throat as needed for mouth pain. 06/24/17   Boston Catarino, Hillard Danker, PA-C    Family History No family history on file.  Social History Social History  Substance Use Topics  . Smoking status: Current Every Day Smoker    Types: Cigarettes  . Smokeless tobacco: Never Used  . Alcohol use Yes     Comment: weekly     Allergies   Penicillins   Review of Systems Review of Systems  Constitutional: Negative for chills and fever.  HENT: Positive for dental problem. Negative for drooling, facial swelling, trouble swallowing and voice change.   Gastrointestinal: Negative for nausea and vomiting.    Musculoskeletal: Negative for neck pain.     Physical Exam Updated Vital Signs BP (!) 146/88 (BP Location: Left Arm)   Pulse 61   Temp 98.7 F (37.1 C) (Oral)   Resp 18   Ht 6\' 2"  (1.88 m)   Wt 108.9 kg (240 lb)   SpO2 100%   BMI 30.81 kg/m   Physical Exam  Constitutional: He appears well-developed and well-nourished. No distress.  HENT:  Head: Normocephalic and atraumatic.  Fractured lower left rear most molar. Tender to touch. No noted facial swelling. No noted area of fluctuance or intraoral swelling. No trismus. Patient handles oral secretions without difficulty.  Eyes: Conjunctivae are normal.  Neck: Normal range of motion. Neck supple.  Cardiovascular: Normal rate and regular rhythm.   Pulmonary/Chest: Effort normal.  Lymphadenopathy:    He has no cervical adenopathy.  Neurological: He is alert.  Skin: Skin is warm and dry. He is not diaphoretic.  Psychiatric: He has a normal mood and affect. His behavior is normal.  Nursing note and vitals reviewed.    ED Treatments / Results  Labs (all labs ordered are listed, but only abnormal results are displayed) Labs Reviewed - No data to display  EKG  EKG Interpretation None       Radiology No results found.  Procedures Dental Block Date/Time: 06/24/2017 8:16 PM Performed by: Anselm Pancoast Authorized by: Harolyn Rutherford  C   Consent:    Consent obtained:  Verbal   Consent given by:  Patient   Risks discussed:  Nerve damage, swelling, unsuccessful block and pain Indications:    Indications: dental pain   Location:    Block type:  Inferior alveolar   Laterality:  Left Procedure details (see MAR for exact dosages):    Syringe type:  Controlled syringe   Needle gauge:  27 G   Anesthetic injected:  Bupivacaine 0.5% WITH epi   Injection procedure:  Anatomic landmarks identified, anatomic landmarks palpated, negative aspiration for blood, introduced needle and incremental injection Post-procedure details:     Outcome:  Anesthesia achieved   Patient tolerance of procedure:  Tolerated well, no immediate complications Comments:     Patient required two different injections as the first one wasn't fully effective.    (including critical care time)  Medications Ordered in ED Medications  bupivacaine-epinephrine (MARCAINE W/ EPI) 0.5% -1:200000 injection 1.8 mL (1.8 mLs Infiltration Given 06/24/17 2021)  clindamycin (CLEOCIN) capsule 450 mg (450 mg Oral Given 06/24/17 2028)  oxyCODONE-acetaminophen (PERCOCET/ROXICET) 5-325 MG per tablet 2 tablet (2 tablets Oral Given 06/24/17 2109)  ketorolac (TORADOL) injection 60 mg (60 mg Intramuscular Given 06/24/17 2107)     Initial Impression / Assessment and Plan / ED Course  I have reviewed the triage vital signs and the nursing notes.  Pertinent labs & imaging results that were available during my care of the patient were reviewed by me and considered in my medical decision making (see chart for details).      Patient presents with dental pain from fractured tooth. Fracture does not come from acute trauma. Suspected to be due to chronic decay. Close dental follow-up. Message sent to the dentist on call. The patient was given instructions for home care as well as return precautions. Patient voices understanding of these instructions, accepts the plan, and is comfortable with discharge.   Final Clinical Impressions(s) / ED Diagnoses   Final diagnoses:  Pain, dental    New Prescriptions Discharge Medication List as of 06/24/2017  8:28 PM    START taking these medications   Details  clindamycin (CLEOCIN) 150 MG capsule Take 3 capsules (450 mg total) by mouth 3 (three) times daily., Starting Tue 06/24/2017, Until Tue 07/01/2017, Print    ibuprofen (ADVIL,MOTRIN) 600 MG tablet Take 1 tablet (600 mg total) by mouth every 6 (six) hours as needed., Starting Tue 06/24/2017, Print    lidocaine (XYLOCAINE) 2 % solution Use as directed 15 mLs in the mouth or throat as  needed for mouth pain., Starting Tue 06/24/2017, Print         Siddhi Dornbush C, PA-C 06/26/17 [id number]    Alvira MondaySchlossman, Erin, MD 06/28/17 1455

## 2018-03-26 ENCOUNTER — Other Ambulatory Visit: Payer: Self-pay

## 2018-03-26 ENCOUNTER — Emergency Department (HOSPITAL_COMMUNITY)
Admission: EM | Admit: 2018-03-26 | Discharge: 2018-03-27 | Disposition: A | Discharge status: Other Acute Inpatient Hospital | Payer: No Typology Code available for payment source | Attending: Emergency Medicine | Admitting: Emergency Medicine

## 2018-03-26 ENCOUNTER — Emergency Department (HOSPITAL_COMMUNITY): Payer: No Typology Code available for payment source

## 2018-03-26 ENCOUNTER — Encounter (HOSPITAL_COMMUNITY): Payer: Self-pay | Admitting: Emergency Medicine

## 2018-03-26 DIAGNOSIS — F1220 Cannabis dependence, uncomplicated: Secondary | ICD-10-CM | POA: Diagnosis not present

## 2018-03-26 DIAGNOSIS — M545 Low back pain, unspecified: Secondary | ICD-10-CM

## 2018-03-26 DIAGNOSIS — F332 Major depressive disorder, recurrent severe without psychotic features: Secondary | ICD-10-CM | POA: Diagnosis not present

## 2018-03-26 DIAGNOSIS — Y9389 Activity, other specified: Secondary | ICD-10-CM | POA: Diagnosis not present

## 2018-03-26 DIAGNOSIS — F17210 Nicotine dependence, cigarettes, uncomplicated: Secondary | ICD-10-CM | POA: Insufficient documentation

## 2018-03-26 DIAGNOSIS — M542 Cervicalgia: Secondary | ICD-10-CM

## 2018-03-26 DIAGNOSIS — R45851 Suicidal ideations: Secondary | ICD-10-CM | POA: Diagnosis not present

## 2018-03-26 DIAGNOSIS — R51 Headache: Secondary | ICD-10-CM | POA: Diagnosis present

## 2018-03-26 DIAGNOSIS — Y929 Unspecified place or not applicable: Secondary | ICD-10-CM | POA: Diagnosis not present

## 2018-03-26 DIAGNOSIS — Y999 Unspecified external cause status: Secondary | ICD-10-CM | POA: Diagnosis not present

## 2018-03-26 DIAGNOSIS — R519 Headache, unspecified: Secondary | ICD-10-CM

## 2018-03-26 HISTORY — DX: Cardiac murmur, unspecified: R01.1

## 2018-03-26 LAB — SALICYLATE LEVEL: Salicylate Lvl: <7 mg/dL (ref 2.8–30.0)

## 2018-03-26 LAB — COMPREHENSIVE METABOLIC PANEL
ALBUMIN: 4.8 g/dL (ref 3.5–5.0)
ALT: 39 U/L (ref 17–63)
ANION GAP: 12 (ref 5–15)
AST: 26 U/L (ref 15–41)
Alkaline Phosphatase: 49 U/L (ref 38–126)
BILIRUBIN TOTAL: 1.1 mg/dL (ref 0.3–1.2)
BUN: 12 mg/dL (ref 6–20)
CHLORIDE: 105 mmol/L (ref 101–111)
CO2: 22 mmol/L (ref 22–32)
Calcium: 9.4 mg/dL (ref 8.9–10.3)
Creatinine, Ser: 1.35 mg/dL — ABNORMAL HIGH (ref 0.61–1.24)
GFR calc Af Amer: >60 mL/min (ref >60)
GFR calc non Af Amer: >60 mL/min (ref >60)
GLUCOSE: 116 mg/dL — AB (ref 65–99)
POTASSIUM: 3.5 mmol/L (ref 3.5–5.1)
SODIUM: 139 mmol/L (ref 135–145)
Total Protein: 8.2 g/dL — ABNORMAL HIGH (ref 6.5–8.1)

## 2018-03-26 LAB — ETHANOL

## 2018-03-26 LAB — CBC
HCT: 47.4 % (ref 39.0–52.0)
Hemoglobin: 16.5 g/dL (ref 13.0–17.0)
MCH: 28.7 pg (ref 26.0–34.0)
MCHC: 34.8 g/dL (ref 30.0–36.0)
MCV: 82.4 fL (ref 78.0–100.0)
PLATELETS: 202 10*3/uL (ref 150–400)
RBC: 5.75 MIL/uL (ref 4.22–5.81)
RDW: 12.7 % (ref 11.5–15.5)
WBC: 7 10*3/uL (ref 4.0–10.5)

## 2018-03-26 LAB — ACETAMINOPHEN LEVEL

## 2018-03-26 LAB — RAPID URINE DRUG SCREEN, HOSP PERFORMED
AMPHETAMINES: NOT DETECTED
BENZODIAZEPINES: NOT DETECTED
Barbiturates: NOT DETECTED
COCAINE: NOT DETECTED
Opiates: NOT DETECTED
Tetrahydrocannabinol: POSITIVE — AB

## 2018-03-26 MED ORDER — IBUPROFEN 200 MG PO TABS
600.0000 mg | ORAL_TABLET | Freq: Three times a day (TID) | ORAL | Status: DC | PRN
  Administered 2018-03-26: 600 mg via ORAL
  Filled 2018-03-26: qty 3

## 2018-03-26 NOTE — ED Notes (Signed)
TTS Trey at bedside to speak with pt.

## 2018-03-26 NOTE — ED Notes (Signed)
Patient transported to CT 
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
## 2018-03-26 NOTE — ED Notes (Signed)
Pt aware that a urine sample is needed.  Pt given urinal.  RN notified.

## 2018-03-26 NOTE — ED Notes (Signed)
Bed: [facility] Expected date:  Expected time:  Means of arrival:  Comments: 
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
## 2018-03-26 NOTE — ED Notes (Signed)
Bed: [facility] Expected date:  Expected time:  Means of arrival:  Comments: [place]
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
## 2018-03-27 ENCOUNTER — Encounter (HOSPITAL_COMMUNITY): Payer: Self-pay

## 2018-03-27 ENCOUNTER — Other Ambulatory Visit: Payer: Self-pay

## 2018-03-27 ENCOUNTER — Inpatient Hospital Stay (HOSPITAL_COMMUNITY)
Admission: AD | Admit: 2018-03-27 | Discharge: 2018-03-30 | DRG: 885 | Payer: No Typology Code available for payment source | Source: Intra-hospital | Attending: Psychiatry | Admitting: Psychiatry

## 2018-03-27 DIAGNOSIS — G4700 Insomnia, unspecified: Secondary | ICD-10-CM | POA: Diagnosis not present

## 2018-03-27 DIAGNOSIS — F1290 Cannabis use, unspecified, uncomplicated: Secondary | ICD-10-CM

## 2018-03-27 DIAGNOSIS — F1490 Cocaine use, unspecified, uncomplicated: Secondary | ICD-10-CM | POA: Diagnosis not present

## 2018-03-27 DIAGNOSIS — R450 Nervousness: Secondary | ICD-10-CM | POA: Diagnosis not present

## 2018-03-27 DIAGNOSIS — Z814 Family history of other substance abuse and dependence: Secondary | ICD-10-CM | POA: Diagnosis not present

## 2018-03-27 DIAGNOSIS — Z630 Problems in relationship with spouse or partner: Secondary | ICD-10-CM

## 2018-03-27 DIAGNOSIS — R45851 Suicidal ideations: Secondary | ICD-10-CM | POA: Diagnosis not present

## 2018-03-27 DIAGNOSIS — F419 Anxiety disorder, unspecified: Secondary | ICD-10-CM | POA: Diagnosis present

## 2018-03-27 DIAGNOSIS — R41843 Psychomotor deficit: Secondary | ICD-10-CM | POA: Diagnosis present

## 2018-03-27 DIAGNOSIS — Z810 Family history of intellectual disabilities: Secondary | ICD-10-CM

## 2018-03-27 DIAGNOSIS — F17210 Nicotine dependence, cigarettes, uncomplicated: Secondary | ICD-10-CM | POA: Diagnosis not present

## 2018-03-27 DIAGNOSIS — F1099 Alcohol use, unspecified with unspecified alcohol-induced disorder: Secondary | ICD-10-CM | POA: Diagnosis not present

## 2018-03-27 DIAGNOSIS — Z91410 Personal history of adult physical and sexual abuse: Secondary | ICD-10-CM

## 2018-03-27 DIAGNOSIS — F332 Major depressive disorder, recurrent severe without psychotic features: Principal | ICD-10-CM | POA: Diagnosis present

## 2018-03-27 DIAGNOSIS — R440 Auditory hallucinations: Secondary | ICD-10-CM | POA: Diagnosis present

## 2018-03-27 DIAGNOSIS — Z560 Unemployment, unspecified: Secondary | ICD-10-CM

## 2018-03-27 DIAGNOSIS — Z653 Problems related to other legal circumstances: Secondary | ICD-10-CM

## 2018-03-27 LAB — HEMOGLOBIN A1C
HEMOGLOBIN A1C: 4.9 % (ref 4.8–5.6)
MEAN PLASMA GLUCOSE: 93.93 mg/dL

## 2018-03-27 LAB — LIPID PANEL
CHOL/HDL RATIO: 5 ratio
Cholesterol: 184 mg/dL (ref 0–200)
HDL: 37 mg/dL — ABNORMAL LOW (ref >40)
LDL Cholesterol: 133 mg/dL — ABNORMAL HIGH (ref 0–99)
Triglycerides: 72 mg/dL (ref <150)
VLDL: 14 mg/dL (ref 0–40)

## 2018-03-27 LAB — TSH: TSH: 2.393 u[IU]/mL (ref 0.350–4.500)

## 2018-03-27 MED ORDER — ALUM & MAG HYDROXIDE-SIMETH 200-200-20 MG/5ML PO SUSP: 30.0000 mL | ORAL | Status: DC | PRN

## 2018-03-27 MED ORDER — TRAZODONE HCL 100 MG PO TABS
100.0000 mg | ORAL_TABLET | Freq: Every evening | ORAL | Status: DC | PRN
  Administered 2018-03-27 – 2018-03-29 (×4): 100 mg via ORAL
  Filled 2018-03-27 (×2): qty 1
  Filled 2018-03-27: qty 7
  Filled 2018-03-27 (×2): qty 1

## 2018-03-27 MED ORDER — HYDROXYZINE HCL 25 MG PO TABS
25.0000 mg | ORAL_TABLET | Freq: Three times a day (TID) | ORAL | Status: DC | PRN
  Administered 2018-03-28 – 2018-03-29 (×2): 25 mg via ORAL
  Filled 2018-03-27: qty 1
  Filled 2018-03-27: qty 10
  Filled 2018-03-27: qty 1

## 2018-03-27 MED ORDER — MAGNESIUM HYDROXIDE 400 MG/5ML PO SUSP: 30.0000 mL | Freq: Every day | ORAL | Status: DC | PRN

## 2018-03-27 MED ORDER — ACETAMINOPHEN 325 MG PO TABS: 650.0000 mg | ORAL_TABLET | Freq: Four times a day (QID) | ORAL | Status: DC | PRN
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
## 2018-03-27 NOTE — Tx Team (Signed)
Interdisciplinary Treatment and Diagnostic Plan Update  03/27/2018 Time of Session: 0830AM Longino Trefz MRN: [id number]  Principal Diagnosis: MDD, recurrent, severe  Secondary Diagnoses: Active Problems:   MDD (major depressive disorder), recurrent episode, severe (HCC)   Current Medications:  Current Facility-Administered Medications  Medication Dose Route Frequency Provider Last Rate Last Dose  . acetaminophen (TYLENOL) tablet 650 mg  650 mg Oral Q6H PRN Nanci Pina, FNP      . alum & mag hydroxide-simeth (MAALOX/MYLANTA) 200-200-20 MG/5ML suspension 30 mL  30 mL Oral Q4H PRN Nanci Pina, FNP      . hydrOXYzine (ATARAX/VISTARIL) tablet 25 mg  25 mg Oral TID PRN Nanci Pina, FNP      . magnesium hydroxide (MILK OF MAGNESIA) suspension 30 mL  30 mL Oral Daily PRN Starkes, Takia S, FNP      . traZODone (DESYREL) tablet 100 mg  100 mg Oral QHS PRN Nanci Pina, FNP   100 mg at 03/27/18 0307   PTA Medications: Medications Prior to Admission  Medication Sig Dispense Refill Last Dose  . ibuprofen (ADVIL,MOTRIN) 600 MG tablet Take 1 tablet (600 mg total) by mouth every 6 (six) hours as needed. (Patient not taking: Reported on 03/26/2018) 30 tablet 0 Completed Course at Unknown time  . lidocaine (XYLOCAINE) 2 % solution Use as directed 15 mLs in the mouth or throat as needed for mouth pain. (Patient not taking: Reported on 03/26/2018) 100 mL 2 Completed Course at Unknown time    Patient Stressors: Financial difficulties Marital or family conflict Substance abuse  Patient Strengths: Average or above average intelligence Capable of independent living Motivation for treatment/growth Supportive family/friends  Treatment Modalities: Medication Management, Group therapy, Case management,  1 to 1 session with clinician, Psychoeducation, Recreational therapy.   Physician Treatment Plan for Primary Diagnosis:  MDD, recurrent, severe  Medication Management: Evaluate  patient's response, side effects, and tolerance of medication regimen.  Therapeutic Interventions: 1 to 1 sessions, Unit Group sessions and Medication administration.  Evaluation of Outcomes: Not Met  Physician Treatment Plan for Secondary Diagnosis: Active Problems:   MDD (major depressive disorder), recurrent episode, severe (HCC)   Medication Management: Evaluate patient's response, side effects, and tolerance of medication regimen.  Therapeutic Interventions: 1 to 1 sessions, Unit Group sessions and Medication administration.  Evaluation of Outcomes: Not Met   RN Treatment Plan for Primary Diagnosis: MDD, recurrent, severe Long Term Goal(s): Knowledge of disease and therapeutic regimen to maintain health will improve  Short Term Goals: Ability to remain free from injury will improve, Ability to participate in decision making will improve and Ability to disclose and discuss suicidal ideas  Medication Management: RN will administer medications as ordered by provider, will assess and evaluate patient's response and provide education to patient for prescribed medication. RN will report any adverse and/or side effects to prescribing provider.  Therapeutic Interventions: 1 on 1 counseling sessions, Psychoeducation, Medication administration, Evaluate responses to treatment, Monitor vital signs and CBGs as ordered, Perform/monitor CIWA, COWS, AIMS and Fall Risk screenings as ordered, Perform wound care treatments as ordered.  Evaluation of Outcomes: Not Met   LCSW Treatment Plan for Primary Diagnosis:  MDD, recurrent, severe Long Term Goal(s): Safe transition to appropriate next level of care at discharge, Engage patient in therapeutic group addressing interpersonal concerns.  Short Term Goals: Engage patient in aftercare planning with referrals and resources, Facilitate patient progression through stages of change regarding substance use diagnoses and concerns and Identify triggers  associated  with mental health/substance abuse issues  Therapeutic Interventions: Assess for all discharge needs, 1 to 1 time with Social worker, Explore available resources and support systems, Assess for adequacy in community support network, Educate family and significant other(s) on suicide prevention, Complete Psychosocial Assessment, Interpersonal group therapy.  Evaluation of Outcomes: Not Met   Progress in Treatment: Attending groups: No. Participating in groups: No. New to unit. Continuing to assess.  Taking medication as prescribed: Yes. Toleration medication: Yes. Family/Significant other contact made: No, will contact:  family member if patient consents to collateral contact.  Patient understands diagnosis: No, poor insight at this time.  Discussing patient identified problems/goals with staff: Yes. Medical problems stabilized or resolved: Yes. Denies suicidal/homicidal ideation: Yes. Issues/concerns per patient self-inventory: No. Other: n/a   New problem(s) identified: No, Describe:  n/a  New Short Term/Long Term Goal(s): detox, medication management for mood stabilization; elimination of SI thoughts; development of comprehensive mental wellness/sobriety plan.   Patient Goal: "to get help with depression and anxiety."  Discharge Plan or Barriers: CSW assessing for appropriate referrals. No current outpatient providers per pt.   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 PT HAS ACTIVE WARRANTS.   Attendees: Patient: Nathan Bernard 03/27/2018 8:38 AM  Physician: Dr. Mallie Darting MD; Dr. Nancy Fetter MD 03/27/2018 8:38 AM  Nursing: Nira Conn RN 03/27/2018 8:38 AM  RN Care Manager:x 03/27/2018 8:38 AM  Social Worker: Maxie Better, LCSW 03/27/2018 8:38 AM  Recreational Therapist: x 03/27/2018 8:38 AM  Other: Lindell Spar NP 03/27/2018 8:38 AM  Other:  03/27/2018 8:38 AM  Other: 03/27/2018 8:38 AM    Scribe for Treatment Team: [place], LCSW 03/27/2018 8:38 AM
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
## 2018-03-27 NOTE — Progress Notes (Signed)
Recreation Therapy Notes  Date: 5.10.19 Time: 0930 Location: 300 Hall Dayroom  Group Topic: Stress Management  Goal Area(s) Addresses:  Patient will verbalize importance of using healthy stress management.  Patient will identify positive emotions associated with healthy stress management.   Behavioral Response: Engaged  Intervention: Stress Management  Activity : Body Scan Meditation.  LRT played meditation that allowed patients to take note of any sensations and feelings they may have been experiencing throughout their bodies.  Patients were to follow along as meditation played.  Education:  Stress Management, Discharge Planning.   Education Outcome: Acknowledges edcuation/In group clarification offered/Needs additional education  Clinical Observations/Feedback: Pt attended group.    Caroll Rancher, LRT/CTRS         Caroll Rancher A 03/27/2018 12:33 PM

## 2018-03-27 NOTE — Plan of Care (Signed)
He has been pleasant and cooperative.  He continues to report feeling depressed and hopeless.  He is hoping to get the treatment that he needs to feel better.
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
## 2018-03-27 NOTE — ED Notes (Signed)
Report called to RN Bonita Quin, [facility] rm [id number].  Pending [place] transportation at 1:45am.

## 2018-03-27 NOTE — BHH Group Notes (Signed)
LCSW Group Therapy Note 03/27/2018 11:15 AM  Type of Therapy and Topic: Group Therapy: Avoiding Self-Sabotaging and Enabling Behaviors  Participation Level: Active  Description of Group:  In this group, patients will learn how to identify obstacles, self-sabotaging and enabling behaviors, as well as: what are they, why do we do them and what needs these behaviors meet. Discuss unhealthy relationships and how to have positive healthy boundaries with those that sabotage and enable. Explore aspects of self-sabotage and enabling in yourself and how to limit these self-destructive behaviors in everyday life.  Therapeutic Goals: 1. Patient will identify one obstacle that relates to self-sabotage and enabling behaviors 2. Patient will identify one personal self-sabotaging or enabling behavior they did prior to admission 3. Patient will state a plan to change the above identified behavior 4. Patient will demonstrate ability to communicate their needs through discussion and/or role play.   Summary of Patient Progress:  Nathan Bernard was engaged and participated throughout the group session. [name] reports that self sabotaging is "doing something that messes you up". [name] reports that his self sabotaging behaviors are negative thinking and procrastination.    Therapeutic Modalities:  Cognitive Behavioral Therapy Person-Centered Therapy Motivational Interviewing   Baldo Daub LCSWA Clinical Social Worker
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
## 2018-03-27 NOTE — Progress Notes (Signed)
D:  Nathan Bernard has been up and visible on the unit.  He has been attending group.  He has spent much of today in the day room.  He has been noted talking on the phone.  He was pleasant and cooperative.  Affect sad, depressed.  He does report feeling hopeless, helpless and worthless.  He voiced not knowing much of what happened prior to coming in but does want help with his depression and suicidal ideation.  He denies SI at this time but does report that the thoughts come and go.  He is able to contract for safety on the unit.  He completed his self inventory and reported that his depression, hopelessness and anxiety are 10/10 (10 the worst).  He stated his goals for today was "making it to tomorrow" and he will accomplish this goal by "follow rules."   A:  1:1 with RN for support and encouragement.  Medications as ordered.  Q 15 minute checks maintained for safety.  Encouraged participation in group and unit activities.   R:  Nathan Bernard remains safe on the unit.  We will continue to monitor the progress towards his goals.

## 2018-03-27 NOTE — Tx Team (Signed)
Initial Treatment Plan 03/27/2018 5:24 AM Nathan Bernard ZOX:[id number]    PATIENT STRESSORS: Financial difficulties Marital or family conflict Substance abuse   PATIENT STRENGTHS: Average or above average intelligence Capable of independent living Motivation for treatment/growth Supportive family/friends   PATIENT IDENTIFIED PROBLEMS:   depression  anxiety  Substance abuse  Risk for suicide  A/V hallucination  "mental health"  "stop being in denial"       DISCHARGE CRITERIA:  Adequate post-discharge living arrangements Improved stabilization in mood, thinking, and/or behavior Motivation to continue treatment in a less acute level of care Need for constant or close observation no longer present Verbal commitment to aftercare and medication compliance  PRELIMINARY DISCHARGE PLAN: Outpatient therapy Participate in family therapy Return to previous work or school arrangements  PATIENT/FAMILY INVOLVEMENT: This treatment plan has been presented to and reviewed with the patient, [name], The patient and family have been given the opportunity to ask questions and make suggestions.  JEHU-APPIAH, Salley Scarlet, RN 03/27/2018, 5:24 AM

## 2018-03-28 DIAGNOSIS — G4700 Insomnia, unspecified: Secondary | ICD-10-CM

## 2018-03-28 DIAGNOSIS — R450 Nervousness: Secondary | ICD-10-CM

## 2018-03-28 DIAGNOSIS — F419 Anxiety disorder, unspecified: Secondary | ICD-10-CM

## 2018-03-28 MED ORDER — SERTRALINE HCL 25 MG PO TABS
25.0000 mg | ORAL_TABLET | Freq: Every day | ORAL | Status: DC
  Administered 2018-03-28 – 2018-03-29 (×2): 25 mg via ORAL
  Filled 2018-03-28 (×4): qty 1

## 2018-03-28 NOTE — Progress Notes (Signed)
Pt reports he is having some mild to moderate withdrawal symptoms which include chills and shakiness although no tremors are visible.  He denies SI/HI/AVH at this time.  He has been observed in the dayroom watching TV.  He makes his needs known to staff.  He has bee polite and cooperative with staff.  Support and encouragement offered.  Discharge plans are in process.  Safety maintained with q15 minute checks.

## 2018-03-28 NOTE — Progress Notes (Signed)
[facility] Group Notes:  (Nursing/MHT/Case Management/Adjunct)  Date:  03/28/2018  Time:  2045  Type of Therapy:  wrap up group  Participation Level:  Active  Participation Quality:  Appropriate, Attentive, Sharing and Supportive  Affect:  Appropriate  Cognitive:  Appropriate  Insight:  Good  Engagement in Group:  Engaged  Modes of Intervention:  Clarification, Education and Support  Summary of Progress/Problems:  Nathan Bernard 03/28/2018, 9:35 PM

## 2018-03-28 NOTE — Progress Notes (Signed)
D: Patient presents calm, cooperative, flat, easygoing. Patient participated in group therapy sessions throughout the day. Demonstrates insight into stressors in life, including substance use and anger, and how to deal with them, using exercise. Patient denies SI/HI/AVH, but reported SI on self-inventory. Depression 7/10, hopelessness 5/10, and anxiety 10/10. Concentration poor, appetite poor. Sleep was fair, trazodone received last night, and was reported as helpful. Patient reports withdrawal of cravings, agitation, chills, irritability.  A: Patient checked q15 min, and checks reviewed. Reviewed medication changes with patient and educated on side effects. Educated patient on importance of attending group therapy sessions and educated on several coping skills. Encouarged participation in milieu through recreation therapy and attending meals with peers. Provided support and encouragement. Followed up with patient regarding self-inventory report of SI. R: Patient receptive to education on medications, and is medication compliant. Patient attending all group therapy sessions and cafeteria with peers. Patient stated basketball as a coping strategy. Patient contracts for safety on the unit. Goal: "getting my mind well" and do this by "participate in groups."

## 2018-03-28 NOTE — Plan of Care (Signed)
Patient presented calm and cooperative, attending group therapy sessions and participating. Patient acknowledges cravings and withdrawal symptoms that are improving.
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
## 2018-03-28 NOTE — Progress Notes (Signed)
D.  Pt pleasant on approach, complaint of some anxiety for which he requested medication.  Pt was positive for evening wrap up group, observed engaged in appropriate interaction with peers on the unit.  Pt denies SI/HI/AVH at this time.  A.  Support and encouragement offered, medicaiton given as ordered for anxiety.  R.  Pt remains safe on the unit, will continue to monitor.

## 2018-03-28 NOTE — [facility] Group Notes (Signed)
[facility] Group Notes:  (Nursing/MHT/Case Management/Adjunct)  Date:  03/28/2018  Time:  4:37 PM   Type of Therapy:  Psychoeducational Skills  Participation Level:  Active  Participation Quality:  Appropriate, Attentive and Sharing  Affect:  Excited  Cognitive:  Alert and Oriented  Insight:  Improving  Engagement in Group:  Engaged  Modes of Intervention:  Activity and Education  Summary of Progress/Problems: Patient participated in group therapy session on TIPP skills and shared some of his favorite coping skills, including playing basketball.  Kirstie Mirza 03/28/2018, 4:37 PM

## 2018-03-28 NOTE — [facility] Group Notes (Signed)
[facility] Group Notes:  (Nursing/MHT/Case Management/Adjunct)  Date:  03/28/2018  Time:  9:25 AM  Type of Therapy:  Psychoeducational Skills  Participation Level:  Active  Participation Quality:  Appropriate  Affect:  Appropriate  Cognitive:  Alert  Insight:  Appropriate  Engagement in Group:  Engaged  Modes of Intervention:  Discussion and Education  Summary of Progress/Problems:  Pt attended and participated Orientation/ Psychoeducational group. During this group staff discussed the unit rules and the treatment agreement. Pts then participated in a therapeutic game.      Nathan Bernard G Aaditya Letizia 03/28/2018, 9:25 AM 

## 2018-03-28 NOTE — [facility] Group Notes (Signed)
LCSW Group Therapy Note  03/28/2018   9:30--10:30am   Type of Therapy and Topic:  Group Therapy: Anger Cues and Responses  Participation Level:  Active   Description of Group:   In this group, patients learned how to recognize the physical, cognitive, emotional, and behavioral responses they have to anger-provoking situations.  They identified a recent time they became angry and how they reacted.  They analyzed how their reaction was possibly beneficial and how it was possibly unhelpful.  The group discussed a variety of healthier coping skills that could help with such a situation in the future.  Deep breathing was practiced briefly.  Therapeutic Goals: 1. Patients will remember their last incident of anger and how they felt emotionally and physically, what their thoughts were at the time, and how they behaved. 2. Patients will identify how their behavior at that time worked for them, as well as how it worked against them. 3. Patients will explore possible new behaviors to use in future anger situations. 4. Patients will learn that anger itself is normal and cannot be eliminated, and that healthier reactions can assist with resolving conflict rather than worsening situations.  Summary of Patient Progress:  The patient state that the last time he was angry was when he woke up, because he is not used to this different environment, was feeling guilty about what he did, and has been accused of something he did not do.  He later talked about how his best friend killed his wife, then himself recently, and talked as though he could empathize with these actions.  As the discussion evolved, he started to demonstrate some growing insight into his choices.  Therapeutic Modalities:   Cognitive Behavioral Therapy  Lynnell Chad

## 2018-03-29 MED ORDER — SERTRALINE HCL 50 MG PO TABS
50.0000 mg | ORAL_TABLET | Freq: Every day | ORAL | Status: DC
  Administered 2018-03-30: 50 mg via ORAL
  Filled 2018-03-29 (×2): qty 1
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
## 2018-03-29 NOTE — Progress Notes (Signed)
D.  Pt pleasant on approach, denies complaints at this time.  Pt was positive for evening AA group, observed engaged in appropriate interaction with peers on the unit.  Pt denies SI/HI/AVH at this time.  A.  Support and encouragement offered, medication given as ordered.  R.  Pt remains safe on the unit, will continue to monitor.   

## 2018-03-29 NOTE — Progress Notes (Signed)
Pt attended goals group and orientation today. Pt goal for the day is to work on himself.

## 2018-03-30 MED ORDER — TRAZODONE HCL 100 MG PO TABS: 100.0000 mg | ORAL_TABLET | Freq: Every evening | ORAL | 0 refills | Status: DC | PRN

## 2018-03-30 MED ORDER — SERTRALINE HCL 50 MG PO TABS: 50.0000 mg | ORAL_TABLET | Freq: Every day | ORAL | 0 refills | Status: DC

## 2018-03-30 MED ORDER — SERTRALINE HCL 50 MG PO TABS: 50.0000 mg | ORAL_TABLET | Freq: Every day | ORAL | 1 refills | Status: AC

## 2018-03-30 MED ORDER — HYDROXYZINE HCL 25 MG PO TABS: 25.0000 mg | ORAL_TABLET | Freq: Three times a day (TID) | ORAL | 1 refills | Status: AC | PRN

## 2018-03-30 MED ORDER — HYDROXYZINE HCL 25 MG PO TABS: 25.0000 mg | ORAL_TABLET | Freq: Three times a day (TID) | ORAL | 0 refills | Status: DC | PRN

## 2018-03-30 MED ORDER — TRAZODONE HCL 100 MG PO TABS: 100.0000 mg | ORAL_TABLET | Freq: Every evening | ORAL | 1 refills | Status: AC | PRN

## 2018-03-30 NOTE — [facility] Suicide Risk Assessment (Signed)
[facility] Discharge Suicide Risk Assessment   Principal Problem: MDD (major depressive disorder), recurrent episode, severe (HCC) Discharge Diagnoses:  Patient Active Problem List   Diagnosis Date Noted  . MDD (major depressive disorder), recurrent episode, severe (HCC) [F33.2] 03/27/2018  . Chronic back pain [M54.9, G89.29]     Total Time spent with patient: 30 minutes  Musculoskeletal: Strength & Muscle Tone: within normal limits Gait & Station: normal Patient leans: N/A  Psychiatric Specialty Exam: Review of Systems  All other systems reviewed and are negative.   Blood pressure 139/79, pulse 96, temperature 98.3 F (36.8 C), temperature source Oral, resp. rate 18, height  (1.88 m), weight 108.9 kg (240 lb).Body mass index is 30.81 kg/m.  General Appearance: Casual  Eye Contact::  Fair  Speech:  Normal Rate409  Volume:  Decreased  Mood:  Dysphoric  Affect:  Appropriate  Thought Process:  Coherent  Orientation:  Full (Time, Place, and Person)  Thought Content:  Logical  Suicidal Thoughts:  No  Homicidal Thoughts:  No  Memory:  Immediate;   Fair  Judgement:  Intact  Insight:  Lacking  Psychomotor Activity:  Normal  Concentration:  Fair  Recall:  [employer] of Knowledge:Fair  Language: Fair  Akathisia:  Negative  Handed:  Right  AIMS (if indicated):     Assets:  Desire for Improvement  Sleep:  Number of Hours: 6  Cognition: WNL  ADL's:  Intact   Mental Status Per Nursing Assessment::   On Admission:  Suicidal ideation indicated by patient, Self-harm thoughts  Demographic Factors:  Male, Low socioeconomic status and Unemployed  Loss Factors: Legal issues  Historical Factors: Impulsivity  Risk Reduction Factors:   NA  Continued Clinical Symptoms:  Alcohol/Substance Abuse/Dependencies  Cognitive Features That Contribute To Risk:  None    Suicide Risk:  Minimal: No identifiable suicidal ideation.  Patients presenting with no risk factors but with  morbid ruminations; may be classified as minimal risk based on the severity of the depressive symptoms  Follow-up Information    [facility] Follow up on 04/03/2018.   Specialty:  Behavioral Health Why:  Hospital follow-up on Friday, 5/17 at 8:00AM. Please bring: photo ID, social security card, and any proof of income if you have it to this appt. Thank you.  Contact information: [address] [place] [address] [phone]           Plan Of Care/Follow-up recommendations:  Activity:  ad lib  Antonieta Pert, MD 03/30/2018, 9:28 AM

## 2018-03-30 NOTE — Progress Notes (Signed)
  [facility] Adult Case Management Discharge Plan :  Will you be returning to the same living situation after discharge:  No. pt going to jail at discharge  At discharge, do you have transportation home?: Yes,  [employer] to be called by security at discharge per order on chart.  Do you have the ability to pay for your medications: Yes,  mental health  Release of information consent forms completed and submitted to medical records by CSW.  Patient to Follow up at: Follow-up Information    [facility] Follow up on 04/03/2018.   Specialty:  Behavioral Health Why:  Hospital follow-up on Friday, 5/17 at 8:00AM. Please bring: photo ID, social security card, and any proof of income if you have it to this appt. Thank you.  Contact information: [address] [place] [address] [phone]           Next level of care provider has access to [facility] Link:no  Safety Planning and Suicide Prevention discussed: Yes,  SPE completed with pt; pt declined to consent to collateral contact. SPI pamphlet and mobile crisis information provided to pt.   Have you used any form of tobacco in the last 30 days? (Cigarettes, Smokeless Tobacco, Cigars, and/or Pipes): Yes  Has patient been referred to the Quitline?: Patient refused referral  Patient has been referred for addiction treatment: Yes  [employer], LCSW 03/30/2018, 8:57 AM
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
## 2018-03-30 NOTE — Progress Notes (Signed)
Patient did attend the evening speaker AA meeting.  
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
## 2018-03-30 NOTE — Progress Notes (Signed)
Patient ID: Nathan Bernard, male   DOB: 03-23-1986, 32 y.o.   MRN: [id number]  Nursing Progress Note [id number]  Data: Patient presents with animated affect and pleasant mood. Patient complaint with scheduled medications. Patient denies SI/HI/AVH or pain. Patient contracts for safety on the unit at this time. Patient affect appears incongruent with self-inventory sheet. Patient completed self-inventory sheet and rates depression, hopelessness, and anxiety 10,10,10 respectively. Patient rates their sleep and appetite as fair/poor respectively. Patient declines to provide a goal for today. Patient is seen visible in the milieu.  Action: Patient educated about and provided medication per provider's orders. Patient safety maintained with q15 min safety checks. Low fall risk precautions in place. Emotional support given. 1:1 interaction and active listening provided. Patient encouraged to attend meals and groups. Labs, vital signs and patient behavior monitored throughout shift. Patient encouraged to work on treatment plan and goals.  Response: Patient remains safe on the unit at this time. Patient is interacting with peers appropriately on the unit. Will continue to support and monitor.

## 2018-04-19 ENCOUNTER — Emergency Department (HOSPITAL_COMMUNITY)
Admission: EM | Admit: 2018-04-19 | Discharge: 2018-04-19 | Disposition: A | Discharge status: Home / Self Care | Payer: Self-pay | Attending: Emergency Medicine | Admitting: Emergency Medicine

## 2018-04-19 ENCOUNTER — Encounter (HOSPITAL_COMMUNITY): Payer: Self-pay

## 2018-04-19 ENCOUNTER — Other Ambulatory Visit: Payer: Self-pay

## 2018-04-19 DIAGNOSIS — R7989 Other specified abnormal findings of blood chemistry: Secondary | ICD-10-CM | POA: Insufficient documentation

## 2018-04-19 DIAGNOSIS — F17210 Nicotine dependence, cigarettes, uncomplicated: Secondary | ICD-10-CM | POA: Insufficient documentation

## 2018-04-19 DIAGNOSIS — F329 Major depressive disorder, single episode, unspecified: Secondary | ICD-10-CM | POA: Insufficient documentation

## 2018-04-19 DIAGNOSIS — Z79899 Other long term (current) drug therapy: Secondary | ICD-10-CM | POA: Insufficient documentation

## 2018-04-19 DIAGNOSIS — R21 Rash and other nonspecific skin eruption: Secondary | ICD-10-CM | POA: Insufficient documentation

## 2018-04-19 MED ORDER — HYDROCORTISONE 1 % EX CREA: TOPICAL_CREAM | CUTANEOUS | 0 refills | Status: AC

## 2018-04-19 NOTE — ED Triage Notes (Signed)
Pt states that he was told he "has something going on with his liver" [facility]. Pt also reports a rash on left forearm X2 weeks.
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
## 2018-04-19 NOTE — Discharge Instructions (Signed)
Please see the information and instructions below regarding your visit.  Your diagnoses today include:  1. Rash and nonspecific skin eruption   2. Elevated serum creatinine     Tests performed today include: See side panel of your discharge paperwork for testing performed today. Vital signs are listed at the bottom of these instructions.   Your kidney function was elevated, specifically the serum creatinine test, 2 weeks ago.  This is the only time you have a history of an elevated creatinine value.  Your liver values were not elevated.  Creatinine can be elevated for many reasons including body stress, acute illness, or dehydration.  It should be rechecked within the month, but it is not urgent to be checked today if you are otherwise feeling well.  Please follow-up with the clinics listed, and I also placed a consult to case management to assist you with such.  Medications prescribed:    Take any prescribed medications only as prescribed, and any over the counter medications only as directed on the packaging.  Please apply the hydrocortisone cream twice a day for a week over the rash.  Home care instructions:  Please follow any educational materials contained in this packet.   Follow-up instructions: Please follow-up with your primary care provider in 2 weeks for further evaluation of your symptoms if they are not completely improved.   Return instructions:  Please return to the Emergency Department if you experience worsening symptoms.  Please return if you have any other emergent concerns.  Additional Information:   Your vital signs today were: BP 132/76 (BP Location: Right Arm)    Pulse 62    Temp 98.6 F (37 C) (Oral)    Resp 18    Ht 6\' 2"  (1.88 m)    Wt 104.3 kg (230 lb)    SpO2 99%    BMI 29.53 kg/m  If your blood pressure (BP) was elevated on multiple readings during this visit above 130 for the top number or above 80 for the bottom number, please have this repeated by  your primary care provider within one month. --------------  Thank you for allowing us to participate in your care today.

## 2018-07-27 ENCOUNTER — Other Ambulatory Visit: Payer: Self-pay

## 2018-07-27 ENCOUNTER — Emergency Department (HOSPITAL_BASED_OUTPATIENT_CLINIC_OR_DEPARTMENT_OTHER)
Admission: EM | Admit: 2018-07-27 | Discharge: 2018-07-27 | Disposition: A | Discharge status: Home / Self Care | Payer: Self-pay | Attending: Emergency Medicine | Admitting: Emergency Medicine

## 2018-07-27 ENCOUNTER — Emergency Department (HOSPITAL_BASED_OUTPATIENT_CLINIC_OR_DEPARTMENT_OTHER): Payer: Self-pay

## 2018-07-27 ENCOUNTER — Encounter (HOSPITAL_BASED_OUTPATIENT_CLINIC_OR_DEPARTMENT_OTHER): Payer: Self-pay | Admitting: *Deleted

## 2018-07-27 DIAGNOSIS — F1410 Cocaine abuse, uncomplicated: Secondary | ICD-10-CM | POA: Insufficient documentation

## 2018-07-27 DIAGNOSIS — F1210 Cannabis abuse, uncomplicated: Secondary | ICD-10-CM | POA: Insufficient documentation

## 2018-07-27 DIAGNOSIS — F17210 Nicotine dependence, cigarettes, uncomplicated: Secondary | ICD-10-CM | POA: Insufficient documentation

## 2018-07-27 DIAGNOSIS — W228XXA Striking against or struck by other objects, initial encounter: Secondary | ICD-10-CM | POA: Insufficient documentation

## 2018-07-27 DIAGNOSIS — Y998 Other external cause status: Secondary | ICD-10-CM | POA: Insufficient documentation

## 2018-07-27 DIAGNOSIS — Z79899 Other long term (current) drug therapy: Secondary | ICD-10-CM | POA: Insufficient documentation

## 2018-07-27 DIAGNOSIS — S86011A Strain of right Achilles tendon, initial encounter: Secondary | ICD-10-CM | POA: Insufficient documentation

## 2018-07-27 DIAGNOSIS — Y9389 Activity, other specified: Secondary | ICD-10-CM | POA: Insufficient documentation

## 2018-07-27 DIAGNOSIS — Y929 Unspecified place or not applicable: Secondary | ICD-10-CM | POA: Insufficient documentation

## 2018-07-27 MED ORDER — HYDROCODONE-ACETAMINOPHEN 5-325 MG PO TABS: 1.0000 | ORAL_TABLET | ORAL | 0 refills | Status: AC | PRN

## 2018-07-27 MED ORDER — IBUPROFEN 800 MG PO TABS
800.0000 mg | ORAL_TABLET | Freq: Once | ORAL | Status: AC
  Administered 2018-07-27: 800 mg via ORAL
  Filled 2018-07-27: qty 1

## 2018-07-27 MED ORDER — HYDROCODONE-ACETAMINOPHEN 5-325 MG PO TABS
1.0000 | ORAL_TABLET | Freq: Once | ORAL | Status: AC
  Administered 2018-07-27: 1 via ORAL
  Filled 2018-07-27: qty 1

## 2018-07-27 MED ORDER — IBUPROFEN 800 MG PO TABS: 800.0000 mg | ORAL_TABLET | Freq: Three times a day (TID) | ORAL | 0 refills | Status: AC

## 2018-07-27 NOTE — ED Provider Notes (Signed)
[facility] EMERGENCY DEPARTMENT Provider Note   CSN: [id number] Arrival date & time: 07/27/18  1155     History   Chief Complaint Chief Complaint  Patient presents with  . Leg Pain    HPI Nathan Bernard is a 32 y.o. male.  Pt presents to the ED today with right leg pain.  Pt said he kicked something and felt a pain in his right calf and above his right heel.  PT unable to walk on leg.  No other injuries.     Past Medical History:  Diagnosis Date  . Chronic back pain   . Heart murmur     Patient Active Problem List   Diagnosis Date Noted  . MDD (major depressive disorder), recurrent episode, severe (HCC) 03/27/2018  . Chronic back pain     History reviewed. No pertinent surgical history.      Home Medications    Prior to Admission medications   Medication Sig Start Date End Date Taking? Authorizing Provider  HYDROcodone-acetaminophen (NORCO/VICODIN) 5-325 MG tablet Take 1 tablet by mouth every 4 (four) hours as needed. 07/27/18   Jacalyn Lefevre, MD  hydrocortisone cream 1 % Apply to affected area 2 times daily 04/19/18   Aviva Kluver B, PA-C  hydrOXYzine (ATARAX/VISTARIL) 25 MG tablet Take 1 tablet (25 mg total) by mouth 3 (three) times daily as needed for anxiety. 03/30/18   Charm Rings, NP  ibuprofen (ADVIL,MOTRIN) 800 MG tablet Take 1 tablet (800 mg total) by mouth 3 (three) times daily. 07/27/18   Jacalyn Lefevre, MD  sertraline (ZOLOFT) 50 MG tablet Take 1 tablet (50 mg total) by mouth daily. 03/31/18   Charm Rings, NP  traZODone (DESYREL) 100 MG tablet Take 1 tablet (100 mg total) by mouth at bedtime as needed for sleep. 03/30/18   Charm Rings, NP    Family History No family history on file.  Social History Social History   Tobacco Use  . Smoking status: Current Every Day Smoker    Types: Cigarettes  . Smokeless tobacco: Never Used  Substance Use Topics  . Alcohol use: Yes    Alcohol/week: 4.0 standard drinks    Types: 4 Cans  of beer per week    Comment: weekly  . Drug use: Yes    Types: Cocaine, Marijuana    Comment: HEROIN, denies today 04-19-18     Allergies   Fish-derived products and Penicillins   Review of Systems Review of Systems  Musculoskeletal:       Right achilles tendon pain  All other systems reviewed and are negative.    Physical Exam Updated Vital Signs BP 133/82 (BP Location: Left Arm)   Pulse 75   Temp 98.8 F (37.1 C) (Oral)   Resp 16   Ht 6\' 1"  (1.854 m)   Wt 104.3 kg   SpO2 98%   BMI 30.34 kg/m   Physical Exam  Constitutional: He is oriented to person, place, and time. He appears well-developed and well-nourished.  HENT:  Head: Normocephalic and atraumatic.  Right Ear: External ear normal.  Left Ear: External ear normal.  Nose: Nose normal.  Mouth/Throat: Oropharynx is clear and moist.  Eyes: Pupils are equal, round, and reactive to light. Conjunctivae and EOM are normal.  Neck: Normal range of motion. Neck supple.  Cardiovascular: Normal rate, regular rhythm, normal heart sounds and intact distal pulses.  Pulmonary/Chest: Effort normal and breath sounds normal.  Abdominal: Soft. Bowel sounds are normal.  Musculoskeletal:  Feet:  + Thompson's test  Neurological: He is alert and oriented to person, place, and time.  Skin: Skin is warm. Capillary refill takes less than 2 seconds.  Psychiatric: He has a normal mood and affect. His behavior is normal. Judgment and thought content normal.  Nursing note and vitals reviewed.    ED Treatments / Results  Labs (all labs ordered are listed, but only abnormal results are displayed) Labs Reviewed - No data to display  EKG None  Radiology Dg Ankle Complete Right  Result Date: 07/27/2018 CLINICAL DATA:  Posterior right ankle pain since the patient kicked something today. Initial encounter. EXAM: RIGHT ANKLE - COMPLETE 3+ VIEW COMPARISON:  None. FINDINGS: No acute bony or joint abnormality is identified. The  distal Achilles tendon is thickened. Calcification at the musculotendinous junction is identified. Soft tissues are otherwise unremarkable. IMPRESSION: Thickened Achilles tendon consistent with tendinopathy. Plain films are not sensitive for evaluation of Achilles tear. Calcification at the musculotendinous junction of the Achilles is consistent with remote strain. No acute bony abnormality. Electronically Signed   By: Drusilla Kanner M.D.   On: 07/27/2018 13:21    Procedures Procedures (including critical care time)  Medications Ordered in ED Medications  HYDROcodone-acetaminophen (NORCO/VICODIN) 5-325 MG per tablet 1 tablet (has no administration in time range)  ibuprofen (ADVIL,MOTRIN) tablet 800 mg (has no administration in time range)     Initial Impression / Assessment and Plan / ED Course  I have reviewed the triage vital signs and the nursing notes.  Pertinent labs & imaging results that were available during my care of the patient were reviewed by me and considered in my medical decision making (see chart for details).    I suspect pt has a rupture of the Achilles tendon.  He is placed in a cam walker and instructed to f/u with ortho.  Return if worse.  Final Clinical Impressions(s) / ED Diagnoses   Final diagnoses:  Rupture of right Achilles tendon, initial encounter    ED Discharge Orders         Ordered    ibuprofen (ADVIL,MOTRIN) 800 MG tablet  3 times daily     07/27/18 1331    HYDROcodone-acetaminophen (NORCO/VICODIN) 5-325 MG tablet  Every 4 hours PRN     07/27/18 1331           Jacalyn Lefevre, MD 07/27/18 1333

## 2018-07-27 NOTE — ED Notes (Signed)
ED Provider at bedside. 

## 2018-07-27 NOTE — ED Triage Notes (Signed)
Pain in his right calf after kicking something 2 hours ago. He can't put pressure on his heel.
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
## 2022-04-15 ENCOUNTER — Other Ambulatory Visit: Payer: Self-pay

## 2022-04-15 ENCOUNTER — Encounter (HOSPITAL_COMMUNITY): Payer: Self-pay | Admitting: Emergency Medicine

## 2022-04-15 ENCOUNTER — Emergency Department (HOSPITAL_COMMUNITY): Payer: BC Managed Care – PPO

## 2022-04-15 ENCOUNTER — Emergency Department (HOSPITAL_COMMUNITY)
Admission: EM | Admit: 2022-04-15 | Discharge: 2022-04-15 | Disposition: A | Discharge status: Home / Self Care | Payer: BC Managed Care – PPO | Attending: Emergency Medicine | Admitting: Emergency Medicine

## 2022-04-15 DIAGNOSIS — J181 Lobar pneumonia, unspecified organism: Secondary | ICD-10-CM | POA: Diagnosis not present

## 2022-04-15 DIAGNOSIS — J189 Pneumonia, unspecified organism: Secondary | ICD-10-CM

## 2022-04-15 DIAGNOSIS — R079 Chest pain, unspecified: Secondary | ICD-10-CM

## 2022-04-15 DIAGNOSIS — R0602 Shortness of breath: Secondary | ICD-10-CM | POA: Diagnosis present

## 2022-04-15 LAB — COMPREHENSIVE METABOLIC PANEL
ALT: 19 U/L (ref 0–44)
AST: 15 U/L (ref 15–41)
Albumin: 4.2 g/dL (ref 3.5–5.0)
Alkaline Phosphatase: 54 U/L (ref 38–126)
Anion gap: 5 (ref 5–15)
BUN: 12 mg/dL (ref 6–20)
CO2: 26 mmol/L (ref 22–32)
Calcium: 8.8 mg/dL — ABNORMAL LOW (ref 8.9–10.3)
Chloride: 107 mmol/L (ref 98–111)
Creatinine, Ser: 1.18 mg/dL (ref 0.61–1.24)
GFR, Estimated: >60 mL/min (ref >60)
Glucose, Bld: 95 mg/dL (ref 70–99)
Potassium: 3.8 mmol/L (ref 3.5–5.1)
Sodium: 138 mmol/L (ref 135–145)
Total Bilirubin: 0.9 mg/dL (ref 0.3–1.2)
Total Protein: 7.4 g/dL (ref 6.5–8.1)

## 2022-04-15 LAB — CBC WITH DIFFERENTIAL/PLATELET
Abs Immature Granulocytes: 0.02 10*3/uL (ref 0.00–0.07)
Basophils Absolute: 0 10*3/uL (ref 0.0–0.1)
Basophils Relative: 1 %
Eosinophils Absolute: 0.1 10*3/uL (ref 0.0–0.5)
Eosinophils Relative: 2 %
HCT: 43.6 % (ref 39.0–52.0)
Hemoglobin: 15.3 g/dL (ref 13.0–17.0)
Immature Granulocytes: 0 %
Lymphocytes Relative: 21 %
Lymphs Abs: 1.5 10*3/uL (ref 0.7–4.0)
MCH: 29.3 pg (ref 26.0–34.0)
MCHC: 35.1 g/dL (ref 30.0–36.0)
MCV: 83.4 fL (ref 80.0–100.0)
Monocytes Absolute: 0.5 10*3/uL (ref 0.1–1.0)
Monocytes Relative: 7 %
Neutro Abs: 5.1 10*3/uL (ref 1.7–7.7)
Neutrophils Relative %: 69 %
Platelets: 171 10*3/uL (ref 150–400)
RBC: 5.23 MIL/uL (ref 4.22–5.81)
RDW: 12.8 % (ref 11.5–15.5)
WBC: 7.3 10*3/uL (ref 4.0–10.5)
nRBC: 0 % (ref 0.0–0.2)

## 2022-04-15 LAB — D-DIMER, QUANTITATIVE: D-Dimer, Quant: 0.7 ug/mL-FEU — ABNORMAL HIGH (ref 0.00–0.50)

## 2022-04-15 LAB — TROPONIN I (HIGH SENSITIVITY): Troponin I (High Sensitivity): <2 ng/L (ref <18)

## 2022-04-15 MED ORDER — IOHEXOL 350 MG/ML SOLN
75.0000 mL | Freq: Once | INTRAVENOUS | Status: AC | PRN
  Administered 2022-04-15: 75 mL via INTRAVENOUS

## 2022-04-15 MED ORDER — AZITHROMYCIN 250 MG PO TABS: 250.0000 mg | ORAL_TABLET | Freq: Every day | ORAL | 0 refills | Status: AC

## 2022-04-15 MED ORDER — KETOROLAC TROMETHAMINE 30 MG/ML IJ SOLN
30.0000 mg | Freq: Once | INTRAMUSCULAR | Status: AC
  Administered 2022-04-15: 30 mg via INTRAVENOUS
  Filled 2022-04-15: qty 1

## 2022-04-15 MED ORDER — ALBUTEROL SULFATE HFA 108 (90 BASE) MCG/ACT IN AERS: 2.0000 | INHALATION_SPRAY | RESPIRATORY_TRACT | Status: DC | PRN

## 2022-04-15 NOTE — ED Provider Notes (Signed)
[facility]-EMERGENCY DEPT Provider Note   CSN: [id number] Arrival date & time: 04/15/22  1456     History {Add pertinent medical, surgical, social history, OB history to HPI:1} Chief Complaint  Patient presents with   Shortness of Breath    Nathan Bernard is a 36 y.o. male.  He is here complaining of right lower lateral chest wall pain that started around 10 AM this morning.  He said it is sharp in nature and worse if he takes deep breath.  He said he gets this pain maybe once a week or so and its been going on for years.  Usually improves with stretching and does not last this long.  He also states he sometimes will get chest pain in the center of his chest that lasts a minute or so.  This is also been going on for a while and he is not having any of that now.  No trauma no fever.  No cough.  He smokes marijuana denies any cocaine.  No history of any cardiac disease.  The history is provided by the patient.  Chest Pain Pain location:  R lateral chest Pain quality: stabbing   Pain radiates to:  Does not radiate Pain severity:  Moderate Onset quality:  Sudden Duration:  9 hours Timing:  Intermittent Progression:  Unchanged Chronicity:  Recurrent Relieved by:  None tried Worsened by:  Deep breathing and movement Ineffective treatments:  Certain positions Associated symptoms: shortness of breath   Associated symptoms: no abdominal pain, no back pain, no cough, no diaphoresis, no fever, no headache, no nausea, no near-syncope and no vomiting   Risk factors: male sex and smoking       Home Medications Prior to Admission medications   Medication Sig Start Date End Date Taking? Authorizing Provider  HYDROcodone-acetaminophen (NORCO/VICODIN) 5-325 MG tablet Take 1 tablet by mouth every 4 (four) hours as needed. 07/27/18   Jacalyn Lefevre, MD  hydrocortisone cream 1 % Apply to affected area 2 times daily 04/19/18   Aviva Kluver B, PA-C  hydrOXYzine  (ATARAX/VISTARIL) 25 MG tablet Take 1 tablet (25 mg total) by mouth 3 (three) times daily as needed for anxiety. 03/30/18   Charm Rings, NP  ibuprofen (ADVIL,MOTRIN) 800 MG tablet Take 1 tablet (800 mg total) by mouth 3 (three) times daily. 07/27/18   Jacalyn Lefevre, MD  sertraline (ZOLOFT) 50 MG tablet Take 1 tablet (50 mg total) by mouth daily. 03/31/18   Charm Rings, NP  traZODone (DESYREL) 100 MG tablet Take 1 tablet (100 mg total) by mouth at bedtime as needed for sleep. 03/30/18   Charm Rings, NP      Allergies    Fish-derived products and Penicillins    Review of Systems   Review of Systems  Constitutional:  Negative for diaphoresis and fever.  HENT:  Negative for sore throat.   Eyes:  Negative for visual disturbance.  Respiratory:  Positive for shortness of breath. Negative for cough.   Cardiovascular:  Positive for chest pain. Negative for near-syncope.  Gastrointestinal:  Negative for abdominal pain, nausea and vomiting.  Genitourinary:  Negative for dysuria.  Musculoskeletal:  Negative for back pain.  Skin:  Negative for rash.  Neurological:  Negative for headaches.   Physical Exam Updated Vital Signs BP (!) 131/59   Pulse (!) 56   Temp 99.1 F (37.3 C) (Oral)   Resp (!) 23   Ht 6\' 2"  (1.88 m)   Wt 113.4 kg  SpO2 97%   BMI 32.09 kg/m  Physical Exam Vitals and nursing note reviewed.  Constitutional:      General: He is not in acute distress.    Appearance: He is well-developed.  HENT:     Head: Normocephalic and atraumatic.  Eyes:     Conjunctiva/sclera: Conjunctivae normal.  Cardiovascular:     Rate and Rhythm: Normal rate and regular rhythm.     Heart sounds: No murmur heard. Pulmonary:     Effort: Pulmonary effort is normal. No respiratory distress.     Breath sounds: Normal breath sounds.  Chest:     Chest wall: Tenderness (Right lateral chest reproducible with palpation.  No crepitus) present.  Abdominal:     Palpations: Abdomen is soft.      Tenderness: There is no abdominal tenderness. There is no guarding or rebound.  Musculoskeletal:        General: No swelling.     Cervical back: Neck supple.     Right lower leg: No tenderness. No edema.     Left lower leg: No tenderness. No edema.  Skin:    General: Skin is warm and dry.     Capillary Refill: Capillary refill takes less than 2 seconds.  Neurological:     General: No focal deficit present.     Mental Status: He is alert.    ED Results / Procedures / Treatments   Labs (all labs ordered are listed, but only abnormal results are displayed) Labs Reviewed  URINALYSIS, ROUTINE W REFLEX MICROSCOPIC  CBC WITH DIFFERENTIAL/PLATELET  COMPREHENSIVE METABOLIC PANEL  TROPONIN I (HIGH SENSITIVITY)    EKG None  Radiology DG Chest 2 View  Result Date: 04/15/2022 CLINICAL DATA:  Short of breath. Right lower posterior chest wall pain. EXAM: CHEST - 2 VIEW COMPARISON:  None Available. FINDINGS: Normal heart, mediastinum and hila. Lungs are clear and are symmetrically aerated. No pleural effusion or pneumothorax. Skeletal structures are within normal limits. IMPRESSION: Normal chest radiographs. Electronically Signed   By: Amie Portland M.D.   On: 04/15/2022 16:16    Procedures Procedures  {Document cardiac monitor, telemetry assessment procedure when appropriate:1}  Medications Ordered in ED Medications  albuterol (VENTOLIN HFA) 108 (90 Base) MCG/ACT inhaler 2 puff (has no administration in time range)  ketorolac (TORADOL) 30 MG/ML injection 30 mg (has no administration in time range)    ED Course/ Medical Decision Making/ A&P                           Medical Decision Making Amount and/or Complexity of Data Reviewed Labs: ordered. Radiology: ordered.  Risk Prescription drug management.  This patient complains of ***; this involves an extensive number of treatment Options and is a complaint that carries with it a high risk of complications and morbidity. The  differential includes ***  I ordered, reviewed and interpreted labs, which included *** I ordered medication *** and reviewed PMP when indicated. I ordered imaging studies which included *** and I independently    visualized and interpreted imaging which showed *** Additional history obtained from *** Previous records obtained and reviewed *** I consulted *** and discussed lab and imaging findings and discussed disposition.  Cardiac monitoring reviewed, *** Social determinants considered, *** Critical Interventions: ***  After the interventions stated above, I reevaluated the patient and found *** Admission and further testing considered, ***    {Document critical care time when appropriate:1} {Document review of labs and clinical  decision tools ie heart score, Chads2Vasc2 etc:1}  {Document your independent review of radiology images, and any outside records:1} {Document your discussion with family members, caretakers, and with consultants:1} {Document social determinants of health affecting pt's care:1} {Document your decision making why or why not admission, treatments were needed:1} Final Clinical Impression(s) / ED Diagnoses Final diagnoses:  None    Rx / DC Orders ED Discharge Orders     None

## 2022-04-15 NOTE — ED Notes (Signed)
Pt states understanding of dc instructions, importance of follow up. Pt denies questions or concerns and declined transportation assistance upon dc. Pt ambulated w/ a steady gait w/o need for assistance. No belongings left in room upon dc. ? ?

## 2022-04-15 NOTE — ED Triage Notes (Signed)
Pt states he has pain in his right side and has had difficulty breathing deeply since 10am this morning. Pt states that this pain has happened before but usually eases off, and today it is not easing off.

## 2022-04-15 NOTE — Discharge Instructions (Signed)
You were seen in the emergency department for some right-sided chest pain.  You have had this pain before but never lasted this long.  You had blood work chest x-ray and a CAT scan of your chest that showed a right-sided pneumonia.  We are putting you on some antibiotics.  You can use Tylenol and ibuprofen for pain.  Finish your antibiotics.  Return to the emergency department if any worsening or concerning symptoms.

## 2022-04-15 NOTE — ED Provider Triage Note (Signed)
Emergency Medicine Provider Triage Evaluation Note  Nathan Bernard , a 36 y.o. male  was evaluated in triage.  Pt complains of lateral chest pain, border of the right inferior ribs.  States that he has had short-lived pains in this area for years, however this has been constant since around 11 AM.  Has associated shortness of breath.  Pain is worse when he takes a deep breath.  No cough.  No abdominal pain or urinary symptoms. Patient denies risk factors for pulmonary embolism including: unilateral leg swelling, history of DVT/PE/other blood clots, use of exogenous hormones, recent immobilizations, recent surgery, recent travel (>4hr segment), malignancy, hemoptysis.     Review of Systems  Positive: Pleuritic chest pain Negative: Fever, cough  Physical Exam  BP (!) 144/83 (BP Location: Left Arm)   Pulse 63   Temp 99.1 F (37.3 C) (Oral)   Resp 18   Ht 6\' 2"  (1.88 m)   Wt 113.4 kg   SpO2 97%   BMI 32.09 kg/m  Gen:   Awake, no distress   Resp:  Normal effort, taking shallow breaths due to pain but lungs sound clear MSK:   Moves extremities without difficulty  Other:  Area where patient is having the pain is not tender to palpation and I do not see any skin bruising or rash.  Medical Decision Making  Medically screening exam initiated at 3:33 PM.  Appropriate orders placed.  was informed that the remainder of the evaluation will be completed by another provider, this initial triage assessment does not replace that evaluation, and the importance of remaining in the ED until their evaluation is complete.  EKG personally reviewed, no signs of active ischemia.   Barbra Sarks, PA-C 04/15/22 1535

## 2023-12-09 ENCOUNTER — Other Ambulatory Visit: Payer: Self-pay | Admitting: Cardiovascular Disease

## 2023-12-09 DIAGNOSIS — Z0000 Encounter for general adult medical examination without abnormal findings: Secondary | ICD-10-CM

## 2023-12-26 ENCOUNTER — Other Ambulatory Visit: Payer: Self-pay

## 2024-06-25 ENCOUNTER — Encounter (HOSPITAL_BASED_OUTPATIENT_CLINIC_OR_DEPARTMENT_OTHER): Payer: Self-pay | Admitting: Emergency Medicine

## 2024-06-25 ENCOUNTER — Emergency Department (HOSPITAL_BASED_OUTPATIENT_CLINIC_OR_DEPARTMENT_OTHER)
Admission: EM | Admit: 2024-06-25 | Discharge: 2024-06-25 | Disposition: A | Discharge status: Home / Self Care | Payer: Worker's Compensation | Attending: Emergency Medicine | Admitting: Emergency Medicine

## 2024-06-25 ENCOUNTER — Emergency Department (HOSPITAL_COMMUNITY)

## 2024-06-25 ENCOUNTER — Other Ambulatory Visit: Payer: Self-pay

## 2024-06-25 DIAGNOSIS — M5441 Lumbago with sciatica, right side: Secondary | ICD-10-CM | POA: Diagnosis not present

## 2024-06-25 DIAGNOSIS — M5416 Radiculopathy, lumbar region: Secondary | ICD-10-CM

## 2024-06-25 DIAGNOSIS — M5126 Other intervertebral disc displacement, lumbar region: Secondary | ICD-10-CM

## 2024-06-25 DIAGNOSIS — M545 Low back pain, unspecified: Secondary | ICD-10-CM | POA: Diagnosis present

## 2024-06-25 DIAGNOSIS — X501XXA Overexertion from prolonged static or awkward postures, initial encounter: Secondary | ICD-10-CM | POA: Insufficient documentation

## 2024-06-25 MED ORDER — CYCLOBENZAPRINE HCL 10 MG PO TABS: 10.0000 mg | ORAL_TABLET | Freq: Two times a day (BID) | ORAL | 0 refills | Status: AC | PRN

## 2024-06-25 MED ORDER — PREDNISONE 20 MG PO TABS
60.0000 mg | ORAL_TABLET | Freq: Once | ORAL | Status: AC
  Administered 2024-06-25: 60 mg via ORAL
  Filled 2024-06-25: qty 3

## 2024-06-25 MED ORDER — KETOROLAC TROMETHAMINE 30 MG/ML IJ SOLN
30.0000 mg | Freq: Once | INTRAMUSCULAR | Status: AC
  Administered 2024-06-25: 30 mg via INTRAMUSCULAR
  Filled 2024-06-25: qty 1

## 2024-06-25 MED ORDER — OXYCODONE HCL 5 MG PO TABS: 5.0000 mg | ORAL_TABLET | Freq: Four times a day (QID) | ORAL | 0 refills | Status: AC | PRN

## 2024-06-25 MED ORDER — KETOROLAC TROMETHAMINE 15 MG/ML IJ SOLN
15.0000 mg | Freq: Once | INTRAMUSCULAR | Status: AC
  Administered 2024-06-25: 15 mg via INTRAMUSCULAR
  Filled 2024-06-25: qty 1

## 2024-06-25 MED ORDER — PREDNISONE 10 MG PO TABS: 40.0000 mg | ORAL_TABLET | Freq: Every day | ORAL | 0 refills | Status: AC

## 2024-06-25 MED ORDER — IBUPROFEN 600 MG PO TABS: 600.0000 mg | ORAL_TABLET | Freq: Four times a day (QID) | ORAL | 0 refills | Status: AC | PRN

## 2024-06-25 MED ORDER — OXYCODONE-ACETAMINOPHEN 5-325 MG PO TABS
1.0000 | ORAL_TABLET | Freq: Once | ORAL | Status: AC
  Administered 2024-06-25: 1 via ORAL
  Filled 2024-06-25: qty 1

## 2024-06-25 NOTE — ED Notes (Signed)
 Called MRI to let them know that the pt is here.

## 2024-06-25 NOTE — ED Provider Notes (Signed)
 [facility] and paresthesias down right leg.  Reports it popped at work while lifting yesterday.  DENIES bladder or bowel incontinence or saddle anesthesia.  He is able to walk on it but is more comfortable lying on his side.    Neuro exam benign - motor function intact, sensation in tact, straight leg test positive, patient is able to ambulate and bear weight  MRI showing disc herniation, primarily left sided We'll give him steroids, motrin , follow up with ortho     Cottie Donnice PARAS, MD 06/25/24 2044

## 2024-06-25 NOTE — ED Notes (Signed)
 Pt was instructed of procedures and expectations for the POV transfer to [facility] for MRI of his back.  Pt was given paperwork and then left via a ub[employer] he contacted for transport.

## 2024-06-25 NOTE — ED Provider Notes (Signed)
 [place] [facility] AT [facility] Provider Note   CSN: [id number] Arrival date & time: 06/25/24  1449     Patient presents with: Back Pain   KAYVAN HOEFLING is a 38 y.o. male.   38 yo M with a cc of R sided low back pain.  Started yesterday, lifted something heavy at work and felt a pop and sudden severe pain.  Mostly to the R side of the low back at times goes down the back of the leg to mid thigh.  Denies trauma.  Had some numbness to the groin last night.  No issues urinating or moving his bowels.  Feels like his leg is asleep at times.  Hurt his back in the past but thinks he hurt the other side.  No fevers.    Back Pain      Prior to Admission medications   Medication Sig Start Date End Date Taking? Authorizing Provider  azithromycin  (ZITHROMAX ) 250 MG tablet Take 1 tablet (250 mg total) by mouth daily. Take first 2 tablets together, then 1 every day until finished. 04/15/22   Towana Ozell BROCKS, MD  HYDROcodone -acetaminophen  (NORCO/VICODIN) 5-325 MG tablet Take 1 tablet by mouth every 4 (four) hours as needed. 07/27/18   Haviland, Julie, MD  hydrocortisone  cream 1 % Apply to affected area 2 times daily 04/19/18   Murray, Alyssa B, PA-C  hydrOXYzine  (ATARAX /VISTARIL ) 25 MG tablet Take 1 tablet (25 mg total) by mouth 3 (three) times daily as needed for anxiety. 03/30/18   Jacquetta Sharlot GRADE, NP  ibuprofen  (ADVIL ,MOTRIN ) 800 MG tablet Take 1 tablet (800 mg total) by mouth 3 (three) times daily. 07/27/18   Dean Clarity, MD  sertraline  (ZOLOFT ) 50 MG tablet Take 1 tablet (50 mg total) by mouth daily. 03/31/18   Jacquetta Sharlot GRADE, NP  traZODone  (DESYREL ) 100 MG tablet Take 1 tablet (100 mg total) by mouth at bedtime as needed for sleep. 03/30/18   Jacquetta Sharlot GRADE, NP    Allergies: Fish-derived products and Penicillins    Review of Systems  Musculoskeletal:  Positive for back pain.    Updated Vital Signs BP 101/79 (BP Location: Right Arm)   Pulse 82   Temp  98.9 F (37.2 C)   Resp 16   Ht 6' 2 (1.88 m)   Wt 111.1 kg   SpO2 99%   BMI 31.46 kg/m   Physical Exam Vitals and nursing note reviewed.  Constitutional:      Appearance: He is well-developed.  HENT:     Head: Normocephalic and atraumatic.  Eyes:     Pupils: Pupils are equal, round, and reactive to light.  Neck:     Vascular: No JVD.  Cardiovascular:     Rate and Rhythm: Normal rate and regular rhythm.     Heart sounds: No murmur heard.    No friction rub. No gallop.  Pulmonary:     Effort: No respiratory distress.     Breath sounds: No wheezing.  Abdominal:     General: There is no distension.     Tenderness: There is no abdominal tenderness. There is no guarding or rebound.  Musculoskeletal:        General: Normal range of motion.     Cervical back: Normal range of motion and neck supple.     Comments: PMS intact to the RLE.  No clonus, reflexes 2+ and equal.  No obvious sensory deficit about the groin.   Skin:    Coloration: Skin  is not pale.     Findings: No rash.  Neurological:     Mental Status: He is alert and oriented to person, place, and time.  Psychiatric:        Behavior: Behavior normal.     (all labs ordered are listed, but only abnormal results are displayed) Labs Reviewed - No data to display  EKG: None  Radiology: No results found.   Procedures   Medications Ordered in the ED  ketorolac  (TORADOL ) 15 MG/ML injection 15 mg (has no administration in time range)                                    Medical Decision Making Amount and/or Complexity of Data Reviewed Radiology: ordered.  Risk Prescription drug management.   38 yo M with a cc of R low back pain.  Started yesterday after lifting something heavy at work.  Felt a pop.  Complaining of pain, and tingling to the leg.  Had some groin numbness noticed last night, think its better now.  Went to urgent care told him to come to the ED for MRI.   Discussed risk and benefit of MRI.   Patient electing to have it performed.  Discussed with Dr. Jakie, accepts in ED to ED transfer.   The patients results and plan were reviewed and discussed.   Any x-rays performed were independently reviewed by myself.   Differential diagnosis were considered with the presenting HPI.  Medications  ketorolac  (TORADOL ) 15 MG/ML injection 15 mg (has no administration in time range)    Vitals:   06/25/24 1457 06/25/24 1459  BP: 101/79   Pulse: 82   Resp: 16   Temp: 98.9 F (37.2 C)   SpO2: 99%   Weight:  111.1 kg  Height:  6' 2 (1.88 m)    Final diagnoses:  Acute right-sided low back pain with right-sided sciatica         Final diagnoses:  Acute right-sided low back pain with right-sided sciatica    ED Discharge Orders     None          Emil Share, DO 06/25/24 1523

## 2024-06-25 NOTE — ED Notes (Signed)
 MRI notified that pt was here in waiting room. Spoke with [facility].

## 2024-06-25 NOTE — ED Notes (Signed)
 ED Provider at bedside.

## 2024-06-25 NOTE — ED Triage Notes (Signed)
 Pt reports workplace injury yesterday.  Was lifting an object, felt a pop in lower back.  Now sharps pain in lower back that radiates to R leg.   Also reports intermittent R groin numbness.   Got solumedrol, toradol  at [facility] this AM.

## 2024-06-25 NOTE — ED Notes (Signed)
 Patient transported to MRI

## 2024-06-25 NOTE — ED Triage Notes (Signed)
 Pt transfer from [facility] for MRI.
